# Patient Record
Sex: Female | Born: 1960 | Race: White | Hispanic: No | Marital: Single | Smoking: Former smoker
Health system: Southern US, Community
[De-identification: ages and names within clinical notes are randomized; demographics above are authoritative.]

## PROBLEM LIST (undated history)

## (undated) DIAGNOSIS — M5430 Sciatica, unspecified side: Secondary | ICD-10-CM

## (undated) DIAGNOSIS — I639 Cerebral infarction, unspecified: Secondary | ICD-10-CM

## (undated) DIAGNOSIS — K589 Irritable bowel syndrome without diarrhea: Secondary | ICD-10-CM

## (undated) DIAGNOSIS — I10 Essential (primary) hypertension: Secondary | ICD-10-CM

## (undated) HISTORY — DX: Cerebral infarction, unspecified: I63.9

## (undated) HISTORY — DX: Irritable bowel syndrome without diarrhea: K58.9

## (undated) HISTORY — PX: OTHER SURGICAL HISTORY: SHX169

## (undated) HISTORY — DX: Sciatica, unspecified side: M54.30

---

## 1999-09-23 ENCOUNTER — Encounter: Payer: Self-pay | Admitting: Emergency Medicine

## 1999-09-23 ENCOUNTER — Emergency Department (HOSPITAL_COMMUNITY): Admission: EM | Admit: 1999-09-23 | Discharge: 1999-09-23 | Payer: Self-pay | Admitting: Emergency Medicine

## 2002-04-04 ENCOUNTER — Ambulatory Visit (HOSPITAL_BASED_OUTPATIENT_CLINIC_OR_DEPARTMENT_OTHER): Admission: RE | Admit: 2002-04-04 | Discharge: 2002-04-04 | Payer: Self-pay | Admitting: Orthopedic Surgery

## 2002-06-12 ENCOUNTER — Ambulatory Visit (HOSPITAL_COMMUNITY): Admission: RE | Admit: 2002-06-12 | Discharge: 2002-06-12 | Payer: Self-pay | Admitting: Orthopedic Surgery

## 2004-06-25 ENCOUNTER — Ambulatory Visit: Payer: Self-pay | Admitting: Internal Medicine

## 2005-08-02 ENCOUNTER — Emergency Department (HOSPITAL_COMMUNITY): Admission: EM | Admit: 2005-08-02 | Discharge: 2005-08-02 | Payer: Self-pay | Admitting: Emergency Medicine

## 2006-06-13 ENCOUNTER — Ambulatory Visit: Payer: Self-pay | Admitting: Gastroenterology

## 2007-01-01 ENCOUNTER — Emergency Department (HOSPITAL_COMMUNITY): Admission: EM | Admit: 2007-01-01 | Discharge: 2007-01-01 | Payer: Self-pay | Admitting: Emergency Medicine

## 2007-03-07 ENCOUNTER — Encounter (INDEPENDENT_AMBULATORY_CARE_PROVIDER_SITE_OTHER): Payer: Self-pay | Admitting: Neurology

## 2007-03-07 ENCOUNTER — Ambulatory Visit: Payer: Self-pay

## 2007-03-08 ENCOUNTER — Encounter: Admission: RE | Admit: 2007-03-08 | Discharge: 2007-03-08 | Payer: Self-pay | Admitting: Neurology

## 2007-11-23 DIAGNOSIS — I639 Cerebral infarction, unspecified: Secondary | ICD-10-CM

## 2007-11-23 DIAGNOSIS — K589 Irritable bowel syndrome without diarrhea: Secondary | ICD-10-CM

## 2007-11-23 HISTORY — DX: Cerebral infarction, unspecified: I63.9

## 2007-11-23 HISTORY — DX: Irritable bowel syndrome, unspecified: K58.9

## 2008-05-09 ENCOUNTER — Emergency Department: Payer: Self-pay

## 2010-09-09 NOTE — Op Note (Signed)
   NAME:  Michelle Stanley, Michelle Stanley                         ACCOUNT NO.:  [id number]   MEDICAL RECORD NO.:  [id number]                   PATIENT TYPE:  AMB   LOCATION:  DSC                                  FACILITY:  [facility]   PHYSICIAN:  Thera Flake., M.D.             DATE OF BIRTH:  [birth year]   DATE OF PROCEDURE:  04/04/2002  DATE OF DISCHARGE:                                 OPERATIVE REPORT   INDICATIONS FOR PROCEDURE:  47 one year-old with a right knee injury with  catching and locking of the knee felt to be consistent with a bucket handle  tear of the meniscus felt to be amenable to outpatient surgery.   PREOPERATIVE DIAGNOSIS:  Torn medial meniscus of the right knee.   POSTOPERATIVE DIAGNOSIS:  Torn medial meniscus of the right knee with the  addition of a partial anterior cruciate ligament tear.   PROCEDURES:  1. Medial meniscus repair (two clear fixed).  2. Debridement of anterior cruciate ligament.   SURGEON:  Dyke Brackett, M.D.   ANESTHESIA:  General.   DESCRIPTION OF PROCEDURE:  The patient was examined under anesthesia and had  a Lachman that was probably 2+ and no pivot shift elicited.  With the  arthroscope through the inferomedial and inferolateral portals the  patellofemoral, lateral knee articulation and lateral meniscus were normal.  The anterior cruciate ligament showed probably about a 50% tear of the most  lateral fibers of the anterior cruciate ligament.  The posterior medial  fibers were intact.  The medial meniscus showed a very peripheral tear,  red/white tear, of the posterior horn of ithe meniscus over a distance of  probably 1 cm. This was beginning at the junction of the middle posterior  third of the meniscus.  Freshening of the meniscus was carried out.  Good  meniscus tissue was noted in the intervening segment.  Two clear fixed  anchors reapproximated the tear nicely with good preservation of the tissue  as well as recreation of meniscal  stability.  A synovectomy of the anterior  cruciate ligament stump partial tear was carried out.  The knee was drained  free of fluid.  The portals were closed with nylon and infiltrated with  Marcaine and morphine for a total of 30 cc's 1/2%.                                               Thera Flake., M.D.    WDC/MEDQ  D:  04/04/2002  T:  04/05/2002  Job:  [id number]

## 2011-02-03 LAB — I-STAT 8, (EC8 V) (CONVERTED LAB)
Acid-Base Excess: 1
BUN: 6
Bicarbonate: 24.2 — ABNORMAL HIGH
Chloride: 108
Glucose, Bld: 104 — ABNORMAL HIGH
HCT: 46
Hemoglobin: 15.6 — ABNORMAL HIGH
Operator id: 196461
Potassium: 3.3 — ABNORMAL LOW
Sodium: 139
TCO2: 25
pCO2, Ven: 33.4 — ABNORMAL LOW
pH, Ven: 7.468 — ABNORMAL HIGH

## 2011-02-03 LAB — POCT CARDIAC MARKERS
CKMB, poc: 1.5
Myoglobin, poc: 80.3
Operator id: 196461
Troponin i, poc: <0.05

## 2011-02-03 LAB — POCT I-STAT CREATININE
Creatinine, Ser: 0.8
Operator id: 196461

## 2011-02-03 LAB — D-DIMER, QUANTITATIVE: D-Dimer, Quant: 0.52 — ABNORMAL HIGH

## 2011-09-27 ENCOUNTER — Emergency Department (HOSPITAL_COMMUNITY)
Admission: EM | Admit: 2011-09-27 | Discharge: 2011-09-27 | Disposition: A | Discharge status: Home / Self Care | Payer: Self-pay | Attending: Emergency Medicine | Admitting: Emergency Medicine

## 2011-09-27 ENCOUNTER — Encounter (HOSPITAL_COMMUNITY): Payer: Self-pay | Admitting: Emergency Medicine

## 2011-09-27 DIAGNOSIS — M5430 Sciatica, unspecified side: Secondary | ICD-10-CM | POA: Insufficient documentation

## 2011-09-27 DIAGNOSIS — F17200 Nicotine dependence, unspecified, uncomplicated: Secondary | ICD-10-CM | POA: Insufficient documentation

## 2011-09-27 DIAGNOSIS — I10 Essential (primary) hypertension: Secondary | ICD-10-CM | POA: Insufficient documentation

## 2011-09-27 HISTORY — DX: Essential (primary) hypertension: I10

## 2011-09-27 MED ORDER — HYDROCODONE-ACETAMINOPHEN 5-325 MG PO TABS: 1.0000 | ORAL_TABLET | Freq: Four times a day (QID) | ORAL | Status: AC | PRN

## 2011-09-27 MED ORDER — PREDNISONE (PAK) 10 MG PO TABS: ORAL_TABLET | ORAL | Status: AC

## 2011-09-27 NOTE — ED Notes (Signed)
Patient c/o pain in  rightt hip onset 2 weeks ago states the pain is off and on. States the pain radiates down right  Leg. States after laying or sitting for a period of time pain is worse however after walking for several minutes the pain is a little better. Denies numbness or tingling in leg.

## 2011-09-27 NOTE — Discharge Instructions (Signed)

## 2011-09-27 NOTE — ED Notes (Signed)
Pt c/o lower back pain into left leg x several weeks worse over last several days; pt denies obvious injury

## 2011-09-27 NOTE — ED Provider Notes (Signed)
History   This chart was scribed for Michelle Kras, MD by Melba Coon. The patient was seen in room STRE3/STRE3 and the patient's care was started at 11:48AM.  CSN: [id number] Arrival date & time 09/27/11  1046    Chief Complaint  Patient presents with  . Back Pain   HPI Michelle Stanley is a 51 y.o. female who presents to the Emergency Department complaining of constant, moderate to severe lower back pain that radiates down the left leg with an onset 5 days that has been getting progressively worse. Holding the affected area and walking alleviates the pain; laying down or sitting down for an extended period of time aggravates the pain. No falls or trauma. No Hx of previous similar sx. Sweats present. No HA, fever, neck pain, sore throat, rash, CP, SOB, abd pain, n/v/d, dysuria, or extremity edema, weakness, numbness, or tingling. . No other pertinent medical symptoms.  Past Medical History  Diagnosis Date  . Hypertension     History reviewed. No pertinent past surgical history.  History reviewed. No pertinent family history.  History  Substance Use Topics  . Smoking status: Current Everyday Smoker  . Smokeless tobacco: Not on file  . Alcohol Use: No    OB History    Grav Para Term Preterm Abortions TAB SAB Ect Mult Living                  Review of Systems 10 Systems reviewed and all are negative for acute change except as noted in the HPI.   Allergies  Penicillins  Home Medications   Current Outpatient Rx  Name Route Sig Dispense Refill  . IBUPROFEN 200 MG PO TABS Oral Take 800 mg by mouth every 8 (eight) hours as needed. For pain    . HYDROCODONE-ACETAMINOPHEN 5-325 MG PO TABS Oral Take 1-2 tablets by mouth every 6 (six) hours as needed for pain. 20 tablet 0  . PREDNISONE (PAK) 10 MG PO TABS  Take 6 tabs by mouth daily  for 2 days, then 5 tabs for 2 days, then 4 tabs for 2 days, then 3 tabs for 2 days, 2 tabs for 2 days, then 1 tab by mouth daily for 2 days 42 tablet  0    BP 150/86  Pulse 74  Temp(Src) 98 F (36.7 C) (Oral)  Resp 18  SpO2 98%  Physical Exam  Nursing note and vitals reviewed. Constitutional: She appears well-developed and well-nourished. No distress.  HENT:  Head: Normocephalic and atraumatic.  Right Ear: External ear normal.  Left Ear: External ear normal.  Eyes: Conjunctivae are normal. Right eye exhibits no discharge. Left eye exhibits no discharge. No scleral icterus.  Neck: Neck supple. No tracheal deviation present.  Cardiovascular: Normal rate.   Pulmonary/Chest: Effort normal. No stridor. No respiratory distress.  Musculoskeletal: She exhibits no edema.       Lumbar back: She exhibits no tenderness, no bony tenderness, no swelling, no edema and no deformity.       5/5 strength in bilateral lower extremities; plantar and dorsiflexion nml; nml sensation  Neurological: She is alert. Cranial nerve deficit: no gross deficits.  Skin: Skin is warm and dry. No rash noted.  Psychiatric: She has a normal mood and affect.    ED Course  Procedures (including critical care time)  DIAGNOSTIC STUDIES: Oxygen Saturation is 98% on room air, normal by my interpretation.    COORDINATION OF CARE:  11:52AM -  EDMD will order pain meds for  the pt.   Labs Reviewed - No data to display No results found.   1. Sciatica       MDM  No sign of acute neurological or vascular emergency associated with pt's back pain.  May have a component of sciatica.  Safe for outpatient follow up.  I personally performed the services described in this documentation, which was scribed in my presence.  The recorded information has been reviewed and considered.         Michelle Kras, MD 09/27/11 [phone]

## 2011-10-17 ENCOUNTER — Encounter (HOSPITAL_COMMUNITY): Payer: Self-pay | Admitting: *Deleted

## 2011-10-17 ENCOUNTER — Emergency Department (HOSPITAL_COMMUNITY): Payer: Self-pay

## 2011-10-17 ENCOUNTER — Emergency Department (HOSPITAL_COMMUNITY)
Admission: EM | Admit: 2011-10-17 | Discharge: 2011-10-17 | Disposition: A | Discharge status: Home / Self Care | Payer: Self-pay | Attending: Emergency Medicine | Admitting: Emergency Medicine

## 2011-10-17 DIAGNOSIS — M79609 Pain in unspecified limb: Secondary | ICD-10-CM | POA: Insufficient documentation

## 2011-10-17 DIAGNOSIS — M5126 Other intervertebral disc displacement, lumbar region: Secondary | ICD-10-CM | POA: Insufficient documentation

## 2011-10-17 DIAGNOSIS — M5430 Sciatica, unspecified side: Secondary | ICD-10-CM | POA: Insufficient documentation

## 2011-10-17 DIAGNOSIS — I10 Essential (primary) hypertension: Secondary | ICD-10-CM | POA: Insufficient documentation

## 2011-10-17 MED ORDER — OXYCODONE-ACETAMINOPHEN 5-325 MG PO TABS: 2.0000 | ORAL_TABLET | ORAL | Status: AC | PRN

## 2011-10-17 MED ORDER — HYDROCODONE-ACETAMINOPHEN 5-325 MG PO TABS
2.0000 | ORAL_TABLET | Freq: Once | ORAL | Status: AC
  Administered 2011-10-17: 2 via ORAL
  Filled 2011-10-17: qty 2

## 2011-10-17 MED ORDER — IBUPROFEN 600 MG PO TABS: 600.0000 mg | ORAL_TABLET | Freq: Four times a day (QID) | ORAL | Status: AC | PRN

## 2011-10-17 MED ORDER — PREDNISONE 20 MG PO TABS: 40.0000 mg | ORAL_TABLET | Freq: Every day | ORAL | Status: DC

## 2011-10-17 NOTE — ED Notes (Signed)
Patient with right lower back pain radiating into legs.  Patient seen for same approx.  3 weeks ago with slight improvement post steroids and pain medication, patient states unable to bear weight on legs

## 2011-10-17 NOTE — Discharge Instructions (Signed)
Back Exercises Back exercises help treat and prevent back injuries. The goal is to increase your strength in your belly (abdominal) and back muscles. These exercises can also help with flexibility. Start these exercises when told by your doctor. HOME CARE Back exercises include: Pelvic Tilt.  Lie on your back with your knees bent. Tilt your pelvis until the lower part of your back is against the floor. Hold this position 5 to 10 sec. Repeat this exercise 5 to 10 times.  Knee to Chest.  Pull 1 knee up against your chest and hold for 20 to 30 seconds. Repeat this with the other knee. This may be done with the other leg straight or bent, whichever feels better. Then, pull both knees up against your chest.  Sit-Ups or Curl-Ups.  Bend your knees 90 degrees. Start with tilting your pelvis, and do a partial, slow sit-up. Only lift your upper half 30 to 45 degrees off the floor. Take at least 2 to 3 seonds for each sit-up. Do not do sit-ups with your knees out straight. If partial sit-ups are difficult, simply do the above but with only tightening your belly (abdominal) muscles and holding it as told.  Hip-Lift.  Lie on your back with your knees flexed 90 degrees. Push down with your feet and shoulders as you raise your hips 2 inches off the floor. Hold for 10 seconds, repeat 5 to 10 times.  Back Arches.  Lie on your stomach. Prop yourself up on bent elbows. Slowly press on your hands, causing an arch in your low back. Repeat 3 to 5 times.  Shoulder-Lifts.  Lie face down with arms beside your body. Keep hips and belly pressed to floor as you slowly lift your head and shoulders off the floor.  Do not overdo your exercises. Be careful in the beginning. Exercises may cause you some mild back discomfort. If the pain lasts for more than 15 minutes, stop the exercises until you see your doctor. Improvement with exercise for back problems is slow.  Document Released: 05/13/2010 Document Revised: 03/30/2011  Document Reviewed: 05/13/2010 [employer] Patient Information 2012 [employer]. 

## 2011-10-17 NOTE — ED Provider Notes (Signed)
History   This chart was scribed for Michelle Shi, MD by Sofie Rower. The patient was seen in room TR10C/TR10C and the patient's care was started at 12:28 PM     CSN: [id number]  Arrival date & time 10/17/11  1104   None     Chief Complaint  Patient presents with  . Back Pain    x legs bilaterally    (Consider location/radiation/quality/duration/timing/severity/associated sxs/prior treatment) Patient is a 51 y.o. female presenting with back pain. The history is provided by the patient. No language interpreter was used.  Back Pain  This is a recurrent problem. The current episode started more than 1 week ago. The problem occurs constantly. The problem has been gradually improving. The pain is present in the lumbar spine. The pain radiates to the right thigh. The pain is moderate. The symptoms are aggravated by stress. The pain is the same all the time. Treatments tried: Steroids and pain medications. The treatment provided mild relief.   Michelle Stanley is a 51 y.o. female who presents to the Emergency Department complaining of moderate, intermittent back pain onset three weeks ago with associated symptoms of swelling, radiating right leg pain. The pt informs the EDP that she has slightly improved from taking steroids, however, the pain has become worse this morning. Pt is experiencing radiating pain from the right buttocks down to her right foot. Modifying factors include taking 4 Advil at 6:00AM which provides moderate relief, bearing weight on the legs which intensifies the pain. Pt has a hx of back pain.   Pt denies numbness.      Past Medical History  Diagnosis Date  . Hypertension      History  Substance Use Topics  . Smoking status: Current Everyday Smoker  . Smokeless tobacco: Not on file  . Alcohol Use: No    OB History    Grav Para Term Preterm Abortions TAB SAB Ect Mult Living                  Review of Systems  Musculoskeletal: Positive for back pain.  All  other systems reviewed and are negative.    Allergies  Penicillins  Home Medications   Current Outpatient Rx  Name Route Sig Dispense Refill  . IBUPROFEN 200 MG PO TABS Oral Take 800 mg by mouth every 8 (eight) hours as needed. For pain    . IBUPROFEN 600 MG PO TABS Oral Take 1 tablet (600 mg total) by mouth every 6 (six) hours as needed for pain. 30 tablet 0  . OXYCODONE-ACETAMINOPHEN 5-325 MG PO TABS Oral Take 2 tablets by mouth every 4 (four) hours as needed for pain. 6 tablet 0  . PREDNISONE 20 MG PO TABS Oral Take 2 tablets (40 mg total) by mouth daily. 10 tablet 0    BP 134/77  Pulse 77  Temp 97.8 F (36.6 C) (Oral)  Resp 24  SpO2 98%  Physical Exam  Nursing note and vitals reviewed. Constitutional: She is oriented to person, place, and time. She appears well-developed and well-nourished. No distress.  HENT:  Head: Normocephalic and atraumatic.  Eyes: Pupils are equal, round, and reactive to light.  Neck: Normal range of motion.  Cardiovascular: Normal rate and intact distal pulses.   Pulmonary/Chest: No respiratory distress.  Abdominal: Normal appearance. She exhibits no distension.  Musculoskeletal: Normal range of motion.  Neurological: She is alert and oriented to person, place, and time. No cranial nerve deficit.  1+ patellar reflex on the right,  3+ patellar reflex on the left. 3+ dorsiflex on the right, 3+ dorsiflex on the left.  Skin: Skin is warm and dry. No rash noted.  Psychiatric: She has a normal mood and affect. Her behavior is normal.    ED Course  Procedures (including critical care time)  DIAGNOSTIC STUDIES: Oxygen Saturation is 98% on room air, normal by my interpretation.    COORDINATION OF CARE:   12:32PM- EDP at bedside discusses treatment plan concerning MRI, steroids.   Labs Reviewed - No data to display Mr Lumbar Spine Wo Contrast  10/17/2011  *RADIOLOGY REPORT*  Clinical Data: 3 weeks of severe back pain extending to the right  leg and foot.  The symptoms were particularly painful this morning.  MRI LUMBAR SPINE WITHOUT CONTRAST  Technique:  Multiplanar and multiecho pulse sequences of the lumbar spine were obtained without intravenous contrast.  Comparison: None.  Findings: Normal signals present in the conus medullaris which terminates at L1.  Marrow signal and vertebral body heights are normal.  Slight anterolisthesis at L4-5 is degenerative.  Alignment is otherwise anatomic.  Limited imaging of the abdomen is unremarkable.  The disc levels at L1-2 and above are normal.  L2-3:  A broad-based disc herniation is asymmetric to the left. This extends into the inferior recess of the left neural foramen without significant stenosis.  L3-4:  Mild facet hypertrophy is present bilaterally.  There is no significant disc herniation or stenosis.  L4-5:  Moderate facet hypertrophy is present.  There are joint effusions bilaterally.  There is some uncovering of the disc.  Mild foraminal narrowing is evident bilaterally.  L5-S1:  A shallow central disc protrusion is evident.  There is no significant stenosis.  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  IMPRESSION:  1.  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 2.  Slight anterolisthesis with uncovering of the disc and moderate facet hypertrophy L4-5.  Mild foraminal narrowing is present bilaterally.  Given the extent of facet hypertrophy, the anterolisthesis could be dynamic, flexion and extension images may be useful for further evaluation. Movement could exacerbate the foraminal stenosis. 3.  Shallow central disc protrusion at L5-S1 without significant stenosis.  Original Report Authenticated By: Jamesetta Orleans. MATTERN, M.D.      1. Sciatica       MDM        I personally performed the services described in this documentation, which was scribed in my  presence. The recorded information has been reviewed and considered.     Michelle Shi, MD 10/17/11 [phone]

## 2011-10-30 ENCOUNTER — Ambulatory Visit (INDEPENDENT_AMBULATORY_CARE_PROVIDER_SITE_OTHER): Payer: Self-pay | Admitting: Family Medicine

## 2011-10-30 VITALS — BP 134/86 | Ht 64.0 in | Wt 210.0 lb

## 2011-10-30 DIAGNOSIS — IMO0002 Reserved for concepts with insufficient information to code with codable children: Secondary | ICD-10-CM

## 2011-10-30 DIAGNOSIS — M5410 Radiculopathy, site unspecified: Secondary | ICD-10-CM

## 2011-10-30 MED ORDER — GABAPENTIN 100 MG PO CAPS: ORAL_CAPSULE | ORAL | Status: DC

## 2011-10-30 MED ORDER — IBUPROFEN 800 MG PO TABS: 800.0000 mg | ORAL_TABLET | Freq: Three times a day (TID) | ORAL | Status: AC | PRN

## 2011-10-30 NOTE — Patient Instructions (Addendum)
You have developed a cyst at the S1 level of your spine. I think this is what is causing her pain. I am starting you on a taper up of a medicine called gabapentin.  Take by tablet by mouth Day 1-3:   take one at night  Day 4-6:  Take 2 at night Day 7-10:  take 3 at night Day 11-14:  3 at night, 1 in am Day 15-18:   3 at night, 2 in am Day 19-21: 3 at night, 3 in am Day 22-25: 3 at night, 3 in am, 2 at lunch Day 26 forward : 3 at night, 3 in am, 3 at lunch  I will see her back in 3-4 weeks. If you have any questions please college in the interim. I really think this medicine will significantly decreased her pain when we get it at the right level. I note may be frustrating at first because I know you're in a lot of pain, but hand there I think we'll get some improvement

## 2011-10-31 DIAGNOSIS — M5410 Radiculopathy, site unspecified: Secondary | ICD-10-CM | POA: Insufficient documentation

## 2011-10-31 NOTE — Assessment & Plan Note (Signed)
Long discussion. Will start taper up of gabapentin. We did discuss that ultimately she may benefit from NSU evaluation; currently she has no insurance so we will proceed with appropriate conservative options. Hopefully we can get her radiculopathy calmed down and then work on a core strengthening program to improve body mechanics as I suspect there is a component of dynamic foraminal stenosis.rtc 1 m

## 2011-10-31 NOTE — Progress Notes (Signed)
  Subjective:    Patient ID: Michelle Stanley, female    DOB: 01-22-1961, 51 y.o.   MRN: [id number]  HPI Right low back pain radiating to the posterior leg and foot on the right. No specific injury. Start bothering her about June 1 and likely became excruciating. She was seen at the emergency department and given a steroid dose pack on June 5 which seemed to help while she was on the steroids. After she completed the dose pack the pain came back with its original severity. Doing a lot of standing or try to go upstairs really causes the pain to increase. She cannot lean over to even tie her shoe without having excruciating pain. She's had no leg weakness. She has had one or 2 urinary incontinence episodes but this predated her back problems and seems consistent with urge incontinence. No prior history of low back or hip injury. No history of back surgery. She works at a fairly physical job as a Surveyor, mining but  Does not have to do a lot of heavy lifting.  PERTINENT  PMH / PSH: Right knee arthroscopy for medial meniscus ter History of hypertension  Review of Systems Denies unusual weight change, fever, sweats, chills. See history of present illness above for additional pertinent review of systems.    Objective:   Physical Exam  Vital signs reviewed. GENERAL: Well developed, well nourished, no acute distress BACK: Nontender to percussion of the thoracic or lumbar vertebra. There is no point tenderness or Pierpoint in the lumbar musculature. Lower stream he strength is 5 out of 5 in hip flexion and extension. A very positive straight leg raise at about 60 on the right. NEURO: DTRs 2+ bilaterally equal at the knee and ankle. MUSCULOSKELETAL: Normal muscle bulk and home and quadriceps. 5 out of 5 extension and flexion at the knee bilaterally, 5 out of 5 dorsiflexion and plantar flexion bilaterally.  IMAGING: MRI LS spine. Report:Slight anterolisthesis with uncovering of the disc and  moderate  facet hypertrophy L4-5. Mild foraminal narrowing is present  bilaterally. Given the extent of facet hypertrophy, the  anterolisthesis could be dynamic, flexion and extension images may  be useful for further evaluation. Movement could exacerbate the  foraminal stenosis. Tarlov cyst right S1 foramen  My comments: The facet hypertrophy on the right at L4-L5 seems more significant than "mild" and I suspect there is a dynamic copmponent.        Assessment & Plan:

## 2011-11-20 ENCOUNTER — Encounter: Payer: Self-pay | Admitting: Family Medicine

## 2011-11-20 ENCOUNTER — Ambulatory Visit (INDEPENDENT_AMBULATORY_CARE_PROVIDER_SITE_OTHER): Payer: Self-pay | Admitting: Family Medicine

## 2011-11-20 VITALS — BP 139/92 | HR 94 | Ht 64.0 in | Wt 210.0 lb

## 2011-11-20 DIAGNOSIS — M5410 Radiculopathy, site unspecified: Secondary | ICD-10-CM

## 2011-11-20 DIAGNOSIS — IMO0002 Reserved for concepts with insufficient information to code with codable children: Secondary | ICD-10-CM

## 2011-11-20 NOTE — Patient Instructions (Addendum)
On Aug 4 start with three tabs AM, Three tabs at lunch and 6 at night time. Stay on this dose until I see you back. Let me see you back in 4-6 weeks. I am GLAD you are doing so much better/

## 2011-11-22 NOTE — Assessment & Plan Note (Signed)
Progress with 60% improvement and she is only up to 800 mg of gabapentin at this time. We'll continue to taper her up until she is at 1200. We'll see her back in 4 weeks.

## 2011-11-22 NOTE — Progress Notes (Signed)
  Subjective:    Patient ID: Michelle Stanley, female    DOB: Oct 28, 1960, 51 y.o.   MRN: [id number]  HPI Followup radicular back pain. We have tapered her up on gabapentin at about 60% improvement. She is still having pain particularly in the morning but it is much much less. She is very excited. The pain areas are the same as previously described but the pain is less intense perhaps 4/10 at its worst. She is having periods of time when it is down to 1/10.    Review of Systems Pertinent review of systems: negative for fever or unusual weight change.     Objective:   Physical Exam  Vital signs reviewed. GENERAL: Well developed, well nourished, no acute distress BACK: Nontender to palpation. Lower extremity strength 5 out of 5.      Assessment & Plan:

## 2011-12-29 ENCOUNTER — Ambulatory Visit (INDEPENDENT_AMBULATORY_CARE_PROVIDER_SITE_OTHER): Payer: Self-pay | Admitting: Family Medicine

## 2011-12-29 ENCOUNTER — Other Ambulatory Visit: Payer: Self-pay | Admitting: Family Medicine

## 2011-12-29 ENCOUNTER — Encounter: Payer: Self-pay | Admitting: Family Medicine

## 2011-12-29 VITALS — BP 169/92 | HR 80 | Ht 64.0 in | Wt 210.0 lb

## 2011-12-29 DIAGNOSIS — M5410 Radiculopathy, site unspecified: Secondary | ICD-10-CM

## 2011-12-29 DIAGNOSIS — IMO0002 Reserved for concepts with insufficient information to code with codable children: Secondary | ICD-10-CM

## 2011-12-29 MED ORDER — IBUPROFEN 800 MG PO TABS: 800.0000 mg | ORAL_TABLET | Freq: Three times a day (TID) | ORAL | Status: DC | PRN

## 2011-12-29 MED ORDER — GABAPENTIN 300 MG PO CAPS: ORAL_CAPSULE | ORAL | Status: DC

## 2012-01-01 NOTE — Progress Notes (Signed)
  Subjective:    Patient ID: Michelle Stanley, female    DOB: May 12, 1960, 51 y.o.   MRN: [id number]  HPI  Followup radicular back pain. She is significantly better. She is essentially having only morning time aching low back pain with no radiculopathy unless she does extreme amount of standing. She is able to do everything she needs to do. He's also using ibuprofen along with the gabapentin. Feels like she's about 75-80% improved overall. 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 She is sleeping okay. She's having no numbness, no incontinence, no lower stream any weakness.  Review of Systems See history of present illness.    Objective:   Physical Exam  Vital signs are reviewed. Notably her blood pressure is elevated and she is told to see her primary care provider. GENERAL: Well-developed female no acute distress BACK: Nontender to palpation.Lower stream he strength 5 out of 5 in flexion extension at the hip. GAIT: Normal.       Assessment & Plan:  Low back pain with radiculopathy 60-75% better with ibuprofen and gabapentin. We'll continue at 1200 mg of gabapentin. She'll followup in one month. Still stable at that time we can probably let her go longer. Ultimately she may need gabapentin long-term. Refilled her ibuprofen 800 mg which she takes twice a day or sometimes 3 times a day.

## 2012-05-13 ENCOUNTER — Encounter: Payer: Self-pay | Admitting: Family Medicine

## 2012-05-13 ENCOUNTER — Ambulatory Visit (INDEPENDENT_AMBULATORY_CARE_PROVIDER_SITE_OTHER): Payer: Self-pay | Admitting: Family Medicine

## 2012-05-13 VITALS — BP 142/87 | HR 89 | Ht 64.0 in | Wt 210.0 lb

## 2012-05-13 DIAGNOSIS — M5410 Radiculopathy, site unspecified: Secondary | ICD-10-CM

## 2012-05-13 DIAGNOSIS — IMO0002 Reserved for concepts with insufficient information to code with codable children: Secondary | ICD-10-CM

## 2012-05-13 MED ORDER — GABAPENTIN 300 MG PO CAPS: ORAL_CAPSULE | ORAL | Status: DC

## 2012-05-14 NOTE — Progress Notes (Signed)
  Subjective:    Patient ID: Michelle Stanley, female    DOB: 1960/07/22, 52 y.o.   MRN: [id number]  HPI Followup low back pain with radiculopathy to the right leg. Had been doing really well on her gabapentin, 95% better. 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 No leg weakness. Pain is increased about 20%. No urinary or fecal incontinence. She's otherwise doing well. Not having any problems tolerating the medicine. On couple days she took an extra gabapentin and that really seem to help her pain. She was to discuss that.   Review of Systems Denies fever, sweats, chills.    Objective:   Physical Exam  Vital signs are reviewed GENERAL: Well-developed female no acute distress BILATERAL lower extremity strength 5 out of 5. Some discomfort with straight leg raise but no true radicular pain. Plantar flexion and dorsiflexion 5 out of 5. DTRs 2+ bilaterally equal at the knee. Lower extremity strength in knee flexion and extension 5 out of 5 bilateral.      Assessment & Plan:  I think is perfectly fine to increase her gabapentin and will write her new prescription. I'll see her back 6 months, sooner with problems. We did discuss whether not she can get off his medicine at some point and I think what she's been on a stable dose for 36 months we could certainly try tapering it down. She's done really well with this medicine given her back issues.

## 2012-06-28 ENCOUNTER — Other Ambulatory Visit: Payer: Self-pay | Admitting: Family Medicine

## 2012-08-20 ENCOUNTER — Other Ambulatory Visit: Payer: Self-pay | Admitting: Family Medicine

## 2012-08-23 ENCOUNTER — Other Ambulatory Visit: Payer: Self-pay | Admitting: *Deleted

## 2012-08-23 MED ORDER — GABAPENTIN 300 MG PO CAPS: ORAL_CAPSULE | ORAL | Status: DC

## 2012-12-06 ENCOUNTER — Ambulatory Visit (INDEPENDENT_AMBULATORY_CARE_PROVIDER_SITE_OTHER): Payer: Self-pay | Admitting: Family Medicine

## 2012-12-06 VITALS — BP 129/84 | Ht 64.0 in | Wt 210.0 lb

## 2012-12-06 DIAGNOSIS — IMO0002 Reserved for concepts with insufficient information to code with codable children: Secondary | ICD-10-CM

## 2012-12-06 DIAGNOSIS — M5410 Radiculopathy, site unspecified: Secondary | ICD-10-CM

## 2012-12-06 DIAGNOSIS — E669 Obesity, unspecified: Secondary | ICD-10-CM

## 2012-12-06 MED ORDER — GABAPENTIN 600 MG PO TABS: ORAL_TABLET | ORAL | Status: DC

## 2012-12-06 MED ORDER — NAPROXEN 500 MG PO TABS: 500.0000 mg | ORAL_TABLET | Freq: Two times a day (BID) | ORAL | Status: DC | PRN

## 2012-12-06 NOTE — Patient Instructions (Addendum)
Switch to the naproxen and stop the ibuprofen, You CAN take tylenol in addition to this. I would add 2 500 2 or 3 times a day.  Increase the gabapentin---you are currently on 1800 mg a day---I want to move you to a total of 2400 mg a day. I have sent in some 600 mg tabs. Let me see you in a month

## 2012-12-10 DIAGNOSIS — E669 Obesity, unspecified: Secondary | ICD-10-CM | POA: Insufficient documentation

## 2012-12-10 NOTE — Assessment & Plan Note (Addendum)
Reviewed MRI.The disc levels at L1-2 and above are normal.  L2-3: A broad-based disc herniation is asymmetric to the left.  This extends into the inferior recess of the left neural foramen  without significant stenosis.  L3-4: Mild facet hypertrophy is present bilaterally. There is no  significant disc herniation or stenosis.  L4-5: Moderate facet hypertrophy is present. There are joint  effusions bilaterally. There is some uncovering of the disc. Mild  foraminal narrowing is evident bilaterally.  L5-S1: A shallow central disc protrusion is evident. There is no  significant stenosis.  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  IMPRESSION:  1. Leftward disc herniation at L2-3 with extension into the  inferior recess of the left neural foramen, but no significant  stenosis.  2. Slight anterolisthesis with uncovering of the disc and moderate  facet hypertrophy L4-5. Mild foraminal narrowing is present  bilaterally. Given the extent of facet hypertrophy, the  anterolisthesis could be dynamic, flexion and extension images may  be useful for further evaluation. Movement could exacerbate the  foraminal stenosis.  3. Shallow central disc protrusion at L5-S1 without significant  Stenosis.  comment: I see think the facet hypertrophy is fairly significant. Please this is partly was causing her problem. Also think a Tarlov cyst may be causing some space-occupying type impingement

## 2012-12-10 NOTE — Progress Notes (Signed)
  Subjective:    Patient ID: Michelle Stanley, female    DOB: 07-Nov-1960, 52 y.o.   MRN: [id number]  HPI  followup radiculopathy with lumbar pain. She has had a little bit of increased pain on the left and is now also having some new type of pain on the right. Left leg pain is unchanged in location, radiates down the posterior thigh to the foot. The right sided pain is mostly in the buttock radiating to the posterior right thigh. Does not go past the knee. She's had no ew legWeakness.  Review of Systems No incontinence of bowel or bladder. No numbness in the lower extremities. No fever.    Objective:   Physical Exam Vital signs are reviewed GENERAL: Well-developed overweight female no acute distress BACK mildly diffusely tender to palpation but no significant area point tenderness. There is no defect in the vertebra the back. They're nontender to percussion. Straight leg raise mildly positive on the left, negative on the right. Lower extremity strength is 5 out of 5 in hip flexion and extension. DTRs are 1+ bilaterally equal at the knee and ankle.       Assessment & Plan:   Knee extension and flexion, dorsiflexion plantar flexion have intact strength in bilaterally symmetrical.had some

## 2012-12-13 ENCOUNTER — Telehealth: Payer: Self-pay | Admitting: *Deleted

## 2012-12-13 NOTE — Telephone Encounter (Signed)
Dr. Benson Norway with [facility] and they wanted to know if it was ok to change to change the dosage from 600 mg tablets to 300 mg capsules. It will cost $17 for the 300 mg capsules and $76 for the 600 mg tablets. I ok'ed this

## 2013-01-10 ENCOUNTER — Ambulatory Visit (INDEPENDENT_AMBULATORY_CARE_PROVIDER_SITE_OTHER): Payer: Self-pay | Admitting: Family Medicine

## 2013-01-10 VITALS — BP 146/81 | Ht 64.0 in | Wt 200.0 lb

## 2013-01-10 DIAGNOSIS — M5410 Radiculopathy, site unspecified: Secondary | ICD-10-CM

## 2013-01-10 DIAGNOSIS — IMO0002 Reserved for concepts with insufficient information to code with codable children: Secondary | ICD-10-CM

## 2013-01-10 DIAGNOSIS — M545 Low back pain: Secondary | ICD-10-CM

## 2013-01-10 MED ORDER — IBUPROFEN 800 MG PO TABS: 800.0000 mg | ORAL_TABLET | Freq: Three times a day (TID) | ORAL | Status: DC | PRN

## 2013-01-10 NOTE — Progress Notes (Signed)
  Subjective:    Patient ID: Michelle Stanley, female    DOB: 1960-04-27, 52 y.o.   MRN: [id number]  HPI Followup back pain. It last office visit she was having some new left-sided leg pain. That continues but has localized more on the medial side of her left thigh. It seems to extend from the groin down to read about the knee. She's not had change in bowel or bladder habits. Legs feel a little tingly on the left but no weakness, no falls.   Review of Systems Denies fever, sweats, chills.    Objective:   Physical Exam  Vital signs are reviewed GENERAL: Well-developed overweight female no acute distress HIPS: Internal/external rotation is bilaterally symmetrical and full and painless. Normal strength of hip flexors 5 out of 5 bilaterally symmetrical. NEURO: DTRs 2+ bilaterally equal at the knee and ankle.  IMAGING: I reviewed her MRI from June of 2013. 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 She also had a small cyst at the L5-S1 level that did not impact the cord.    Assessment & Plan:

## 2013-01-10 NOTE — Assessment & Plan Note (Signed)
New left-sided medial thigh pain. Given the findings on her MRI that was done more than a year ago, I am concerned that the synovial cyst on the left to be expanding. We need to get her set up for MRI repeat. In the interim I will continue her on her current dose of gabapentin. She would like to return to a different NSAID, she like ibuprofen better. A follow her up after the MRI.

## 2013-03-26 ENCOUNTER — Other Ambulatory Visit: Payer: Self-pay | Admitting: Family Medicine

## 2013-06-25 ENCOUNTER — Other Ambulatory Visit: Payer: Self-pay | Admitting: Family Medicine

## 2013-07-01 ENCOUNTER — Other Ambulatory Visit: Payer: Self-pay | Admitting: Nurse Practitioner

## 2013-07-01 ENCOUNTER — Ambulatory Visit
Admission: RE | Admit: 2013-07-01 | Discharge: 2013-07-01 | Disposition: A | Discharge status: Home / Self Care | Payer: No Typology Code available for payment source | Source: Ambulatory Visit | Attending: Nurse Practitioner | Admitting: Nurse Practitioner

## 2013-07-01 DIAGNOSIS — R059 Cough, unspecified: Secondary | ICD-10-CM

## 2013-07-01 DIAGNOSIS — R05 Cough: Secondary | ICD-10-CM

## 2013-07-01 DIAGNOSIS — F17200 Nicotine dependence, unspecified, uncomplicated: Secondary | ICD-10-CM

## 2013-08-25 ENCOUNTER — Other Ambulatory Visit: Payer: Self-pay | Admitting: Family Medicine

## 2013-09-24 ENCOUNTER — Other Ambulatory Visit: Payer: Self-pay | Admitting: Family Medicine

## 2014-02-16 ENCOUNTER — Emergency Department: Payer: Self-pay | Admitting: Emergency Medicine

## 2014-02-16 LAB — CBC
HCT: 42.6 % (ref 35.0–47.0)
HGB: 13.3 g/dL (ref 12.0–16.0)
MCH: 29.5 pg (ref 26.0–34.0)
MCHC: 31.2 g/dL — ABNORMAL LOW (ref 32.0–36.0)
MCV: 94 fL (ref 80–100)
PLATELETS: 197 10*3/uL (ref 150–440)
RBC: 4.52 10*6/uL (ref 3.80–5.20)
RDW: 13.6 % (ref 11.5–14.5)
WBC: 14.7 10*3/uL — AB (ref 3.6–11.0)

## 2014-02-16 LAB — BASIC METABOLIC PANEL
Anion Gap: 6 — ABNORMAL LOW (ref 7–16)
BUN: 10 mg/dL (ref 7–18)
CO2: 24 mmol/L (ref 21–32)
Calcium, Total: 8.7 mg/dL (ref 8.5–10.1)
Chloride: 110 mmol/L — ABNORMAL HIGH (ref 98–107)
Creatinine: 0.65 mg/dL (ref 0.60–1.30)
GLUCOSE: 119 mg/dL — AB (ref 65–99)
Potassium: 4 mmol/L (ref 3.5–5.1)
Sodium: 140 mmol/L (ref 136–145)

## 2014-02-16 LAB — TROPONIN I: Troponin-I: <0.02 ng/mL

## 2014-02-16 LAB — D-DIMER(ARMC): D-DIMER: 1057 ng/mL

## 2014-07-25 ENCOUNTER — Inpatient Hospital Stay
Admit: 2014-07-25 | Disposition: A | Discharge status: Home / Self Care | Payer: Self-pay | Attending: Internal Medicine | Admitting: Internal Medicine

## 2014-07-25 LAB — COMPREHENSIVE METABOLIC PANEL
AST: 32 U/L
Albumin: 3.8 g/dL
Alkaline Phosphatase: 82 U/L
Anion Gap: 7 (ref 7–16)
BUN: 9 mg/dL
Bilirubin,Total: 0.5 mg/dL
CHLORIDE: 106 mmol/L
CO2: 26 mmol/L
Calcium, Total: 8.8 mg/dL — ABNORMAL LOW
Creatinine: 0.66 mg/dL
EGFR (Non-African Amer.): >60
Glucose: 122 mg/dL — ABNORMAL HIGH
POTASSIUM: 3.1 mmol/L — AB
SGPT (ALT): 32 U/L
SODIUM: 139 mmol/L
TOTAL PROTEIN: 7.1 g/dL

## 2014-07-25 LAB — URINALYSIS, COMPLETE
BILIRUBIN, UR: NEGATIVE
BLOOD: NEGATIVE
Bacteria: NONE SEEN
GLUCOSE, UR: NEGATIVE mg/dL (ref 0–75)
Ketone: NEGATIVE
LEUKOCYTE ESTERASE: NEGATIVE
NITRITE: NEGATIVE
PH: 6 (ref 4.5–8.0)
Protein: 30
RBC,UR: 2 /HPF (ref 0–5)
Specific Gravity: 1.01 (ref 1.003–1.030)
Squamous Epithelial: 13
WBC UR: 1 /HPF (ref 0–5)

## 2014-07-25 LAB — CBC WITH DIFFERENTIAL/PLATELET
Basophil #: 0 10*3/uL (ref 0.0–0.1)
Basophil %: 0.4 %
Eosinophil #: 0 10*3/uL (ref 0.0–0.7)
Eosinophil %: 0.1 %
HCT: 46.3 % (ref 35.0–47.0)
HGB: 15.4 g/dL (ref 12.0–16.0)
LYMPHS ABS: 1.8 10*3/uL (ref 1.0–3.6)
Lymphocyte %: 40.4 %
MCH: 30.2 pg (ref 26.0–34.0)
MCHC: 33.2 g/dL (ref 32.0–36.0)
MCV: 91 fL (ref 80–100)
MONOS PCT: 11.8 %
Monocyte #: 0.5 x10 3/mm (ref 0.2–0.9)
NEUTROS ABS: 2.1 10*3/uL (ref 1.4–6.5)
Neutrophil %: 47.3 %
Platelet: 134 10*3/uL — ABNORMAL LOW (ref 150–440)
RBC: 5.09 10*6/uL (ref 3.80–5.20)
RDW: 13.5 % (ref 11.5–14.5)
WBC: 4.5 10*3/uL (ref 3.6–11.0)

## 2014-07-25 LAB — TROPONIN I

## 2014-07-26 LAB — BASIC METABOLIC PANEL
Anion Gap: 3 — ABNORMAL LOW (ref 7–16)
BUN: 13 mg/dL
CREATININE: 0.64 mg/dL
Calcium, Total: 8.1 mg/dL — ABNORMAL LOW
Chloride: 110 mmol/L
Co2: 25 mmol/L
Glucose: 162 mg/dL — ABNORMAL HIGH
Potassium: 3.9 mmol/L
Sodium: 138 mmol/L

## 2014-07-26 LAB — CBC WITH DIFFERENTIAL/PLATELET
Basophil #: 0 10*3/uL (ref 0.0–0.1)
Basophil %: 0.2 %
EOS PCT: 0 %
Eosinophil #: 0 10*3/uL (ref 0.0–0.7)
HCT: 40.9 % (ref 35.0–47.0)
HGB: 13.6 g/dL (ref 12.0–16.0)
LYMPHS ABS: 0.8 10*3/uL — AB (ref 1.0–3.6)
Lymphocyte %: 17.3 %
MCH: 30.3 pg (ref 26.0–34.0)
MCHC: 33.4 g/dL (ref 32.0–36.0)
MCV: 91 fL (ref 80–100)
MONOS PCT: 4.5 %
Monocyte #: 0.2 x10 3/mm (ref 0.2–0.9)
Neutrophil #: 3.4 10*3/uL (ref 1.4–6.5)
Neutrophil %: 78 %
PLATELETS: 119 10*3/uL — AB (ref 150–440)
RBC: 4.51 10*6/uL (ref 3.80–5.20)
RDW: 13.5 % (ref 11.5–14.5)
WBC: 4.4 10*3/uL (ref 3.6–11.0)

## 2014-08-04 ENCOUNTER — Encounter: Payer: Self-pay | Admitting: Family Medicine

## 2014-08-04 ENCOUNTER — Ambulatory Visit: Payer: Self-pay | Attending: Family Medicine | Admitting: Family Medicine

## 2014-08-04 VITALS — BP 153/88 | HR 93 | Temp 98.5°F | Resp 20 | Ht 64.0 in | Wt 227.0 lb

## 2014-08-04 DIAGNOSIS — I10 Essential (primary) hypertension: Secondary | ICD-10-CM

## 2014-08-04 DIAGNOSIS — J218 Acute bronchiolitis due to other specified organisms: Secondary | ICD-10-CM

## 2014-08-04 DIAGNOSIS — J209 Acute bronchitis, unspecified: Secondary | ICD-10-CM | POA: Insufficient documentation

## 2014-08-04 DIAGNOSIS — E669 Obesity, unspecified: Secondary | ICD-10-CM

## 2014-08-04 DIAGNOSIS — Z87891 Personal history of nicotine dependence: Secondary | ICD-10-CM | POA: Insufficient documentation

## 2014-08-04 MED ORDER — ALBUTEROL SULFATE HFA 108 (90 BASE) MCG/ACT IN AERS: 2.0000 | INHALATION_SPRAY | Freq: Four times a day (QID) | RESPIRATORY_TRACT | Status: DC | PRN

## 2014-08-04 MED ORDER — LISINOPRIL 10 MG PO TABS: 10.0000 mg | ORAL_TABLET | Freq: Every day | ORAL | Status: DC

## 2014-08-04 NOTE — Patient Instructions (Signed)

## 2014-08-04 NOTE — Progress Notes (Signed)
Patient reports having pneumonia or bronchitis for 3rd time in last 18 months. Patient starts gasping for air. Patient admitted to hospital for oxygen level and breathing last Saturday, 4/1 through Tuesday, 4/4. Patient reports no pain. Breathing has improved with inhaler.

## 2014-08-04 NOTE — Progress Notes (Signed)
Subjective:    Patient ID: Michelle Stanley, female    DOB: 03-14-1961, 54 y.o.   MRN: [id number]  HPI  Michelle Stanley is a new patient presenting today for hospital follow-up from [facility] where she was hospitalized for acute bronchiolitis between 07/24/14 in 07/24/14. We have no medical records in Epic decides a few paper documents precipitated perhaps with her. She had presented for shortness of breath, cough and chest pains and was admitted. She was found to have an elevated blood pressure of 166/84 is no previous history of high blood pressure;Chest x-ray revealed stable left mid lung and bibasilar scarring, cardiomegaly with acute process. She had a CT scan of her chest which revealed scattered mild reticulonodular interstitial accentuation suggesting atypical infectious bronchiolitis and some minor colds scattered atelectasis. In some normal size mediastinal lymph nodes. According to the patient she received IV antibiotics and steroids and was able to quit smoking.      She was discharged once her condition stabilized but was informed she would need to be evaluated for COPD.  Interval history: She reports doing well since discharge and has a few days left on her prednisone and Levaquin tablets. She does have plus. HandiHaler with her which she has been using as a rescue medication and I have advised her that that is more controller medication and she would be needing to use an MDI.  She has a history of chronic low back pain and sees sports medicine and orthopedics where she receives her gabapentin prescription.  Past Medical History  Diagnosis Date  . Hypertension   . Sciatic nerve pain   . Irritable bowel syndrome   . Sciatic pain     Past Surgical History  Procedure Laterality Date  . Knne surgery      History   Social History  . Marital Status: Single    Spouse Name: N/A  . Number of Children: N/A  . Years of Education: N/A   Occupational History  .  Not on file.   Social History Main Topics  . Smoking status: Former Smoker -- 0.50 packs/day    Quit date: 07/17/2014  . Smokeless tobacco: Never Used  . Alcohol Use: No  . Drug Use: No  . Sexual Activity: Not on file   Other Topics Concern  . Not on file   Social History Narrative    Allergies  Allergen Reactions  . Penicillins Other (See Comments)    welps    Current Outpatient Prescriptions on File Prior to Visit  Medication Sig Dispense Refill  . ibuprofen (ADVIL,MOTRIN) 800 MG tablet TAKE 1 TABLET BY MOUTH EVERY 8 HOURS AS NEEDED FOR PAIN 90 tablet prn   No current facility-administered medications on file prior to visit.     Review of Systems  General: negative for fever, weight loss, appetite change Eyes: no visual symptoms. ENT: no ear symptoms, no sinus tenderness, no nasal congestion or sore throat. Neck: no pain  Respiratory: no wheezing, shortness of breath, cough Cardiovascular: no chest pain, no dyspnea on exertion, no pedal edema, no orthopnea. Gastrointestinal: no abdominal pain, no diarrhea, no constipation Genito-Urinary: no urinary frequency, no dysuria, no polyuria. Hematologic: no bruising Endocrine: no cold or heat intolerance Neurological: no headaches, no seizures, no tremors Musculoskeletal: back pain Skin: no pruritus, no rash. Psychological: no depression, no anxiety,       Objective: Filed Vitals:   08/04/14 1405  BP: 153/88  Pulse: 93  Temp: 98.5 F (36.9  C)  Resp: 20      Physical Exam  Constitutional: obese,  Eyes: PERRLA HENT: Head is atraumatic, normal sinuses, normal oropharynx, normal appearing tonsils and palate Neck: normal range of motion, no thyromegaly, no JVD cardiovascular: normal rate and rhythm, normal heart sounds, no murmurs, rub or gallop, no pedal edema Respiratory: clear to auscultation bilaterally, no wheezes, no rales, no rhonchi Abdomen: soft, not tender to palpation, normal bowel sounds, no enlarged  organs Extremities: Full ROM, no tenderness in joints Skin: warm and dry, no lesions. Neurological: alert, oriented x3, cranial nerves I-XII grossly intact Psychological: normal mood.       Assessment & Plan:  54 year old female patient recently discharged from [facility] with respiratory failure secondary to acute bronchiolitis; patient seen with limited records available.  Acute bronchiolitis: Clinical improvement evident. Advised to complete course of prednisone and I am placing her on a metered-dose inhaler tussis with a flow. She would need to be evaluated with a PFT to exclude COPD given the history of smoking as she just stopped 3 weeks ago and PFT done now would be with sub optimal effort given acute illness.  Hypertension: Newly diagnosed: I am commencing lisinopril and she would be reassessed at her next office visit.  Obesity: Encouraged increased physical activity, also to reduce portion sizes.

## 2014-08-18 ENCOUNTER — Encounter: Payer: Self-pay | Admitting: Family Medicine

## 2014-08-18 ENCOUNTER — Ambulatory Visit: Payer: Self-pay | Attending: Family Medicine | Admitting: Family Medicine

## 2014-08-18 VITALS — BP 137/84 | HR 96 | Temp 98.0°F | Resp 18 | Ht 64.0 in | Wt 231.0 lb

## 2014-08-18 DIAGNOSIS — J449 Chronic obstructive pulmonary disease, unspecified: Secondary | ICD-10-CM | POA: Insufficient documentation

## 2014-08-18 DIAGNOSIS — M5410 Radiculopathy, site unspecified: Secondary | ICD-10-CM

## 2014-08-18 DIAGNOSIS — M19042 Primary osteoarthritis, left hand: Secondary | ICD-10-CM

## 2014-08-18 DIAGNOSIS — Z114 Encounter for screening for human immunodeficiency virus [HIV]: Secondary | ICD-10-CM

## 2014-08-18 DIAGNOSIS — I10 Essential (primary) hypertension: Secondary | ICD-10-CM

## 2014-08-18 DIAGNOSIS — K580 Irritable bowel syndrome with diarrhea: Secondary | ICD-10-CM

## 2014-08-18 DIAGNOSIS — M79642 Pain in left hand: Secondary | ICD-10-CM

## 2014-08-18 DIAGNOSIS — R0602 Shortness of breath: Secondary | ICD-10-CM | POA: Insufficient documentation

## 2014-08-18 DIAGNOSIS — M79641 Pain in right hand: Secondary | ICD-10-CM

## 2014-08-18 DIAGNOSIS — Z720 Tobacco use: Secondary | ICD-10-CM

## 2014-08-18 DIAGNOSIS — M19041 Primary osteoarthritis, right hand: Secondary | ICD-10-CM | POA: Insufficient documentation

## 2014-08-18 DIAGNOSIS — F17200 Nicotine dependence, unspecified, uncomplicated: Secondary | ICD-10-CM | POA: Insufficient documentation

## 2014-08-18 DIAGNOSIS — K1370 Unspecified lesions of oral mucosa: Secondary | ICD-10-CM | POA: Insufficient documentation

## 2014-08-18 DIAGNOSIS — J218 Acute bronchiolitis due to other specified organisms: Secondary | ICD-10-CM

## 2014-08-18 LAB — COMPLETE METABOLIC PANEL WITH GFR
ALBUMIN: 3.6 g/dL (ref 3.5–5.2)
ALT: 10 U/L (ref 0–35)
AST: 11 U/L (ref 0–37)
Alkaline Phosphatase: 81 U/L (ref 39–117)
BUN: 8 mg/dL (ref 6–23)
CO2: 22 meq/L (ref 19–32)
Calcium: 8.9 mg/dL (ref 8.4–10.5)
Chloride: 108 mEq/L (ref 96–112)
Creat: 0.7 mg/dL (ref 0.50–1.10)
GLUCOSE: 84 mg/dL (ref 70–99)
Potassium: 4.7 mEq/L (ref 3.5–5.3)
Sodium: 142 mEq/L (ref 135–145)
TOTAL PROTEIN: 6.6 g/dL (ref 6.0–8.3)
Total Bilirubin: 0.4 mg/dL (ref 0.2–1.2)

## 2014-08-18 LAB — CBC
HCT: 38.8 % (ref 36.0–46.0)
Hemoglobin: 12.6 g/dL (ref 12.0–15.0)
MCH: 29.9 pg (ref 26.0–34.0)
MCHC: 32.5 g/dL (ref 30.0–36.0)
MCV: 92.2 fL (ref 78.0–100.0)
MPV: 10.5 fL (ref 8.6–12.4)
Platelets: 300 10*3/uL (ref 150–400)
RBC: 4.21 MIL/uL (ref 3.87–5.11)
RDW: 14.3 % (ref 11.5–15.5)
WBC: 7.1 10*3/uL (ref 4.0–10.5)

## 2014-08-18 LAB — C-REACTIVE PROTEIN: CRP: 0.5 mg/dL (ref <0.60)

## 2014-08-18 LAB — RHEUMATOID FACTOR: Rhuematoid fact SerPl-aCnc: <10 IU/mL (ref <=14)

## 2014-08-18 MED ORDER — GABAPENTIN 300 MG PO CAPS: 600.0000 mg | ORAL_CAPSULE | Freq: Four times a day (QID) | ORAL | Status: DC

## 2014-08-18 MED ORDER — LEVALBUTEROL TARTRATE 45 MCG/ACT IN AERO: 2.0000 | INHALATION_SPRAY | Freq: Four times a day (QID) | RESPIRATORY_TRACT | Status: DC | PRN

## 2014-08-18 MED ORDER — DICLOFENAC SODIUM 75 MG PO TBEC: 75.0000 mg | DELAYED_RELEASE_TABLET | Freq: Two times a day (BID) | ORAL | Status: DC

## 2014-08-18 MED ORDER — LOPERAMIDE HCL 2 MG PO TABS: 2.0000 mg | ORAL_TABLET | Freq: Three times a day (TID) | ORAL | Status: DC

## 2014-08-18 NOTE — Assessment & Plan Note (Signed)
HTN: Goal BP < 140/90 Continue lisinopril  Smoking cessation

## 2014-08-18 NOTE — Assessment & Plan Note (Signed)
Hand pain with burning in L arm: Diclofenac Checking inflammatory markers

## 2014-08-18 NOTE — Assessment & Plan Note (Signed)
IBS with diarrhea: Regular meal pattern; avoidance of large meals; reduced intake of fat, insoluble fibers, caffeine, and gas-producing foods such as beans, cabbage, and onions imodium 2 mg 45 minutes before meals, three times a day

## 2014-08-18 NOTE — Assessment & Plan Note (Signed)
Screening HIV ordered  

## 2014-08-18 NOTE — Assessment & Plan Note (Signed)
A; chronic low back pain, stable. Taking too much ibuprofen P: Diclofenac to replace ibuprofen Continue gabapentin

## 2014-08-18 NOTE — Patient Instructions (Addendum)
Ms. Michelle Stanley,  Thank you for coming in today. It was a pleasure meeting you. I look forward to being your primary doctor.   1. Mouth plaque for 8 days: Rinse with salt and warm water- swish and spit orajel for pain Continue to work on quitting smoking Will follow closely and refer to ENT for biopsy if needed   2. COPD: Continue spiriva daily xopenex is rescue inhaler F/u chest x-ray Pulmonology referral for pulmonary function testing  3. HTN: Goal BP < 140/90 Continue lisinopril   4. Back pain:  Diclofenac to replace ibuprofen Continue gabapentin  5. IBS with diarrhea: Regular meal pattern; avoidance of large meals; reduced intake of fat, insoluble fibers, caffeine, and gas-producing foods such as beans, cabbage, and onions imodium 2 mg 45 minutes before meals, three times a day   6. Hand pain with burning in L arm: Diclofenac Checking inflammatory markers   F/u in 6 weeks for pap smear with me   Dr. Armen PickupFunches

## 2014-08-18 NOTE — Progress Notes (Signed)
Establish Care Complaining of SOB  Hx Tobacco user- Stopped smoking 28 days ago

## 2014-08-18 NOTE — Progress Notes (Addendum)
Subjective:    Patient ID: Michelle Stanley, female    DOB: 1961-04-23, 54 y.o.   MRN: [id number] CC: f/u SOB related to COPD, L arm burning, sore in mouth, finger pain and swelling, IBS with diarrhea HPI 54 yo F smoker:  1. SOB/COPD: patient hospitalized at [facility] 07/24/14-07/27/14  for acute respiratory failure.  Patient brings in records with labs and imaging reports (information submitted for scanning) (no images available). Had evidence of lung scarring, hyperinflation and cardiomegaly w/o congestion on CXR.  Had CT chest with contrast on 07/25/14  That revealed scattered mild reticulonodular interstitial accentuation suggesting atypical infectious bronchiolitis.  She was treated with prednisone taper, Spiriva, xopenex prn. She reports an ECHO was done, no report available, no dx of CHF. She is a longtime smoker. Working to quit smoking using nicotine patch. She has DOE, orthopnea, congestion, cough. She is taking spiriva and xopenex prn. No fever or chest pain. Patient reports PNA x 4 in the past 18 months.   2. HTN: BP elevated during recent hospitalization and at last OV. She was started on lisinopril 10 mg daily. No worsening cough. No edema. No HA or vision change. Slight LE swelling.  3. Sore in mouth: lower mouth sore x 8 days. Painful. No trauma. Recent significant illness requiring steroids. Patient has hx of heavy smoking. Denies heavy ETOH. Treating with peroxide and mouthwash rinses.  4. Burning pain in L arm: x 3 weeks or some. Just below elbow. Comes and goes. Worse with pick up items with L hand. No neck pain or stiffness. No weakness in L hand. No improvement with gabapentin.   5. Hand pain: b/l hand soreness, stiffness, finger joint swelling for many months. Has fam hx of RA. Sometimes has involuntary flexion of finger that she has to manually extended. Takes ibuprofen for chronic low back pain. Has to take 1 800 mg tablet 4-5 times a day although written for TID.   6.  IBS with diarrhea: has frequent BM. Worsening in past 4 months. Loose stool after every meal. No blood in stools. Does have hx of diverticulitis. Denies worsening low back pain, urinary incontinence and groin numbness.   Soc hx: current smoker, cutting back   Review of Systems  Constitutional: Negative for fever and chills.  HENT: Positive for congestion.   Respiratory: Positive for cough and shortness of breath.   Cardiovascular: Positive for leg swelling. Negative for chest pain and palpitations.  Gastrointestinal: Positive for diarrhea. Negative for nausea, vomiting, abdominal pain, constipation, blood in stool, abdominal distention, anal bleeding and rectal pain.  Musculoskeletal: Positive for back pain.  GAD-7: score of 19. 2-4 and 6. 3 to others.     Objective:   Physical Exam BP 137/84 mmHg  Pulse 96  Temp(Src) 98 F (36.7 C) (Oral)  Resp 18  Ht 5\' 4"  (1.626 m)  Wt 231 lb (104.781 kg)  BMI 39.63 kg/m2  SpO2 97%  BP Readings from Last 3 Encounters:  08/18/14 137/84  08/04/14 153/88  01/10/13 146/81   Wt Readings from Last 3 Encounters:  08/18/14 231 lb (104.781 kg)  08/04/14 227 lb (102.967 kg)  01/10/13 200 lb (90.719 kg)   General appearance: alert, cooperative and no distress  Throat: has 1 cm length x 5 mm white plaque on lower inner gumline just left of midline. No bleeding points. No ulcerations. Normal oropharynx.  Neck: no adenopathy, supple, symmetrical, trachea midline and thyroid not enlarged, symmetric, no tenderness/mass/nodules, Full ROM, negative Spurling  Lungs: clear to auscultation bilaterally Heart: regular rate and rhythm, S1, S2 normal, no murmur, click, rub or gallop Abdomen: soft, non-tender; bowel sounds normal; no masses,  no organomegaly Extremities: extremities normal, atraumatic, no cyanosis or edema      Assessment & Plan:

## 2014-08-18 NOTE — Assessment & Plan Note (Addendum)
A: COPD with recent exacerbation, recurrent exacerbation  P: Continue spiriva daily xopenex is rescue inhaler F/u chest x-ray Screening HIV Pulmonology referral for pulmonary function testing

## 2014-08-19 LAB — ANA: Anti Nuclear Antibody(ANA): POSITIVE — AB

## 2014-08-19 LAB — CYCLIC CITRUL PEPTIDE ANTIBODY, IGG: Cyclic Citrullin Peptide Ab: <2 U/mL (ref 0.0–5.0)

## 2014-08-19 LAB — SJOGRENS SYNDROME-A EXTRACTABLE NUCLEAR ANTIBODY: SSA (RO) (ENA) ANTIBODY, IGG: NEGATIVE

## 2014-08-19 LAB — SJOGRENS SYNDROME-B EXTRACTABLE NUCLEAR ANTIBODY: SSB (LA) (ENA) ANTIBODY, IGG: NEGATIVE

## 2014-08-19 LAB — ANTI-NUCLEAR AB-TITER (ANA TITER): ANA Titer 1: NEGATIVE

## 2014-08-19 LAB — SEDIMENTATION RATE: SED RATE: 20 mm/h (ref 0–30)

## 2014-08-19 LAB — ANTI-DNA ANTIBODY, DOUBLE-STRANDED: ds DNA Ab: <1 IU/mL

## 2014-08-19 LAB — HIV ANTIBODY (ROUTINE TESTING W REFLEX): HIV: NONREACTIVE

## 2014-08-23 NOTE — H&P (Signed)
PATIENT NAME:  Michelle Stanley, Michelle Stanley MR#:  [id number] DATE OF BIRTH:  [birth year]  DATE OF ADMISSION:  07/25/2014  PRIMARY CARE PHYSICIAN: None.  EMERGENCY ROOM PHYSICIAN: DR.Forbach Cory   CHIEF COMPLAINT: Abdominal pain.   HISTORY OF PRESENT ILLNESS: A 54 year old female with no past medical problems, comes in because of abdominal pain, nausea, vomiting with a GI illness for about a week. The patient said that her symptoms started with vomiting and diarrhea. She thought had a viral illness with decreased p.o. intake. The patient's diarrhea and vomiting are calming coming down slowly, but she is still not able to eat and keep anything down. Concerning this, she came to the ER. The patient's labs were normal; however, she became hypoxic with O2 saturation dropping to 79% on exertion. Concerning this, we are admitting the patient. The patient's O2 saturations without exertion on room air were like 94%, but dropped to 79% when she got up to walk. The patient was wheezing bilaterally and admitting her for COPD exacerbation. The patient is having some cough and shortness of breath going on for about 3 to 4 days.    PAST MEDICAL HISTORY: Significant for sciatica, hypertension.   ALLERGIES: PENICILLINS.   MEDICATIONS: Neurontin. The patient does not know the dose. She also takes ibuprofen 800 mg daily. She [facility] these medications [facility]. As I mentioned takes Neurontin and Motrin, but does not know the doses. She says she does not take anything else. She does not take Phenergan or Percocet.      SOCIAL HISTORY: Smokes about 1-1/2 packs per day since she was a 54 years old and she smoked the last time on Monday. No drinking. No drugs.   PAST SURGICAL HISTORY: No history of operations.   FAMILY HISTORY: No hypertension or diabetes  REVIEW OF SYSTEMS:  CONSTITUTIONAL: No fever. No fatigue. No weakness.  EYES: No blurred vision.  ENT: No tinnitus. No epistaxis. No  difficulty swallowing.  RESPIRATORY: Does have some cough and shortness of breath.  CARDIOVASCULAR: No chest pain, no orthopnea, no PND.  GASTROINTESTINAL: No nausea. No vomiting. No abdominal pain.  GENITOURINARY: No dysuria.  ENDOCRINE: No polyuria or nocturia.  HEMATOLOGIC: No anemia.  INTEGUMENTARY: No skin rashes.  MUSCULOSKELETAL: No joint pain.  NEUROLOGIC: No numbness or weakness.  PSYCHIATRIC: No anxiety or insomnia.   PHYSICAL EXAMINATION:  VITAL SIGNS: Temperature 98.3, heart rate is 82, blood pressure 138/66, saturations 94% on room air; at rest dropped to 79% on room air with exertion.  GENERAL: This is a well-developed, well-nourished female not in distress.  HEENT: Head: Normocephalic, atraumatic. Eyes: Pupils equal, reacting to light. No conjunctival pallor. No icterus. Nose: No nasal lesions. No drainage. Ears: No drainage or external lesions. Mouth: No lesions, no exudates.  NECK: Supple. No JVD. No carotid bruit. Normal range of motion.  RESPIRATORY: Bilateral expiratory wheeze in all lung fields present. Not using accessory muscles of respiration.  CARDIOVASCULAR: S1, S2 regular. No murmurs. No tachycardia. The patient's pulses are equal at carotid, pedal, and femoral pulses. No peripheral edema.  ABDOMEN: Soft, nontender, nondistended. Bowel sounds are present. No hernias.  MUSCULOSKELETAL: Normal gait and station. No pathology of digits or nails.  EXTREMITIES: Moved x 4.  SKIN: Inspection is normal.  LYMPHATICS: No lymphadenopathy.  VASCULAR: Good pedal pulses.  NEUROLOGIC: Alert, awake, oriented. Cranial nerves II through XII intact. Power 5/5. Sensations are intact. DTRs 2+ bilaterally.  PSYCHIATRIC: Mood and affect are within normal limits.  LABORATORY DATA: Chest x-ray shows stable left midlung and basilar scarring, ( without acute process. CBC: WBC 4.5, hemoglobin 15.4, hematocrit 46.2, platelets 134,000. Troponin less than 0.03. Sodium 139, potassium 3.1,  chloride 106, bicarbonate 26, BUN is 9, creatinine 0.66, glucose 122, calcium 8.8. Urinalysis is clear with no bacteria.   EKG: Normal sinus rhythm with 76 beats per minute.   ASSESSMENT AND PLAN:  1.  The patient is a 54 year old female patient with hypoxia due to chronic obstructive pulmonary disease exacerbation: Admit to hospitalist service, start oxygen, Solu-Medrol, DuoNebs. Empiric Zithromax is started. The patient is ALLERGIC TO PENICILLINS.  needs PFT FOR EVALUATION FOR COPD, 2.  Tobacco abuse: Counseled against smoking for about 10 minutes. The patient is interested in quitting and she says she does not want to smoke again.  3.  Hypokalemia: Replace potassium.  4.  Recent gastrointestinal illness: She is recovering from it. No further treatment except conservative management.  5.  Chest CAT scan is ordered to evaluate for parenchyma of the lung.   TIME SPENT: 55 minutes.    ____________________________ Katha Hamming, MD sk:TT D: 07/25/2014 19:02:40 ET T: 07/25/2014 20:58:59 ET JOB#: [id number]  cc: Katha Hamming, MD, <Dictator> Katha Hamming MD ELECTRONICALLY SIGNED 07/26/2014 15:50

## 2014-08-23 NOTE — Discharge Summary (Signed)
PATIENT NAME:  Michelle Stanley, [name] MR#:  [id number] DATE OF BIRTH:  May 14, 1960  DATE OF ADMISSION:  07/25/2014 DATE OF DISCHARGE:  07/28/2014  ADMISSION DIAGNOSIS: Acute respiratory failure from possible acute chronic obstructive pulmonary disease exacerbation.   DISCHARGE DIAGNOSIS: 1.  Acute respiratory failure from bronchiolitis.  2.  Acute bronchiolitis. 3.  Tobacco dependence.  4.  Stable right adrenal mass.  5.  Abdominal pain with nausea and vomiting and diarrhea, resolved.   CONSULTATIONS: None.   PHYSICAL EXAMINATION AT DISCHARGE:  VITAL SIGNS: The patient was afebrile, temperature 98.1, pulse is 88, respirations 18, blood pressure 143/85, 91% on room air.  GENERAL: The patient is alert, oriented, not in acute distress.  LUNGS: Clear to auscultation without crackles, rales, rhonchi, or wheezing. Normal to percussion.  ABDOMEN: Bowel sounds are positive. Nontender, nondistended. No hepatosplenomegaly. CARDIOVASCULAR: Regular rate and rhythm. No murmurs, gallops, or rubs.   HOSPITAL COURSE: This is a very pleasant 54 year old female with a history of tobacco dependence who presented with wheezing, shortness of breath, found to have acute respiratory failure. For further details, please see prior H and P. 1.  Acute respiratory failure from bronchiolitis. CT scan did show bronchiolitis. No evidence of pulmonary emboli. She has no formal diagnosis of COPD; however, a long history of tobacco dependence. She will need outpatient followup for PFTs to make this clinical diagnosis of COPD.   The patient was initially placed on oxygen. She has weaned off oxygen and is saturating 91% to 93% on room air. She was placed on antibiotics for bronchiolitis, as well as p.o. steroids. 2.  Bronchiolitis. The patient was on azithromycin and steroids.  3.  Tobacco dependence. The patient was encouraged to stop smoking. She was counseled for 3 minutes. She did not want a patch because she did not want a  nicotine patch.  4.  Stable right adrenal mass seen on CT scan. She may need yearly surveillance. 5.  Abdominal pain on admission which had resolved.   DISCHARGE MEDICATIONS:  1.  Gabapentin 300 mg 2 tablets 4 times a day.  2.  Ibuprofen 800 mg 1 to 2 tablets p.r.n.  3.  Xopenex 2 puffs q.i.d. p.r.n.  4.  Prednisone taper starting at 50 mg tapered by 10 mg every 2 days.  5.  Azithromycin 500 mg daily for 5 days.  6.  Spiriva 18 mcg daily.   DISCHARGE DIET: Regular diet.   DISCHARGE ACTIVITY: As tolerated.  DISCHARGE FOLLOWUP: The patient will need to follow up with primary care physician and patient was encouraged to stop smoking.   TIME SPENT: Approximately 40 minutes.  CONDITION: The patient was stable for discharge.   ____________________________ Jezreel Justiniano P. Juliene PinaMody, MD spm:ST D: 07/28/2014 11:47:46 ET T: 07/28/2014 12:23:33 ET JOB#: [id number]  cc: Layn Kye P. Juliene PinaMody, MD, <Dictator> Janyth ContesSITAL P Dona Walby MD ELECTRONICALLY SIGNED 07/29/2014 11:37

## 2014-08-26 ENCOUNTER — Telehealth: Payer: Self-pay | Admitting: *Deleted

## 2014-08-26 NOTE — Telephone Encounter (Signed)
-----   Message from Dessa PhiJosalyn Funches, MD sent at 08/20/2014  9:26 AM EDT ----- Normal labs except for positive ANA with negative titers. Arthritis in hands is most likely osteoarthritis and not rheumatoid arthritis given negative markers of inflammation and rheumatoid factor. Continue current treatment plan

## 2014-08-26 NOTE — Telephone Encounter (Signed)
Pt aware of results 

## 2014-09-07 ENCOUNTER — Other Ambulatory Visit: Payer: Self-pay | Admitting: Family Medicine

## 2014-09-10 ENCOUNTER — Ambulatory Visit (INDEPENDENT_AMBULATORY_CARE_PROVIDER_SITE_OTHER): Payer: Self-pay | Admitting: Internal Medicine

## 2014-09-10 ENCOUNTER — Encounter: Payer: Self-pay | Admitting: Internal Medicine

## 2014-09-10 DIAGNOSIS — J449 Chronic obstructive pulmonary disease, unspecified: Secondary | ICD-10-CM | POA: Insufficient documentation

## 2014-09-10 DIAGNOSIS — J410 Simple chronic bronchitis: Secondary | ICD-10-CM

## 2014-09-10 MED ORDER — TIOTROPIUM BROMIDE MONOHYDRATE 2.5 MCG/ACT IN AERS: 2.0000 | INHALATION_SPRAY | Freq: Every day | RESPIRATORY_TRACT | Status: DC

## 2014-09-10 NOTE — Progress Notes (Signed)
Date: 09/10/2014  MRN# [id number] AMOR HYLE March 15, 1961  Referring Physician:  Dr. Judi Saa is a 54 y.o. old female seen in consultation for Pneumonia\Bronchitis.   CC:  Chief Complaint  Patient presents with  . Advice Only    referred for bronchitis/ recurrent pneumonia 3x in 18 months.    HPI:  Patient is a pleasant 54 year old female seen in consultation today for recent hospitalization of bronchitis and further workup. Patient states that unless CC She's had 3 episodes of pneumonia/proctitis, which included visits to the urgent care. In April 2016 she was seen at [facility] for shortness of breath, cough, fever, diarrhea for which she was admitted 4 days, she was diagnosed with acute respiratory failure from bronchitis and placed on anti-biotics and sterile. Review of hospitalization as stated below. She was discharge on Spiriva, but stop using it due to running out of it. She currently does have a Xopenex inhaler with for which she's been using it 2-3 times per day. She admits to chronic cough with positive sputum production which is thick white at times, she also admits to shortness of breath with movement. Patient is a former smoker quit in March 2016 previously smoked one pack per day for 30 years, admits to intermittent use of marijuana in the past, has 1 pet dog, currently employed as a Teacher, English as a foreign language.    Review of records and Hospitalization by Dr. Dema Severin  DATE OF ADMISSION:  07/25/2014 DATE OF DISCHARGE:  07/28/2014  ADMISSION DIAGNOSIS: Acute respiratory failure from possible acute chronic obstructive pulmonary disease exacerbation.   DISCHARGE DIAGNOSIS: 1.  Acute respiratory failure from bronchiolitis.  2.  Acute bronchiolitis. 3.  Tobacco dependence.  4.  Stable right adrenal mass.  5.  Abdominal pain with nausea and vomiting and diarrhea, resolved.   CONSULTATIONS: None.   PHYSICAL EXAMINATION AT DISCHARGE:  VITAL  SIGNS: The patient was afebrile, temperature 98.1, pulse is 88, respirations 18, blood pressure 143/85, 91% on room air.  GENERAL: The patient is alert, oriented, not in acute distress.  LUNGS: Clear to auscultation without crackles, rales, rhonchi, or wheezing. Normal to percussion.  ABDOMEN: Bowel sounds are positive. Nontender, nondistended. No hepatosplenomegaly. CARDIOVASCULAR: Regular rate and rhythm. No murmurs, gallops, or rubs.   HOSPITAL COURSE: This is a very pleasant 54 year old female with a history of tobacco dependence who presented with wheezing, shortness of breath, found to have acute respiratory failure. For further details, please see prior H and P. 1.  Acute respiratory failure from bronchiolitis. CT scan did show bronchiolitis. No evidence of pulmonary emboli. She has no formal diagnosis of COPD; however, a long history of tobacco dependence. She will need outpatient followup for PFTs to make this clinical diagnosis of COPD.   The patient was initially placed on oxygen. She has weaned off oxygen and is saturating 91% to 93% on room air. She was placed on antibiotics for bronchiolitis, as well as p.o. steroids. 2.  Bronchiolitis. The patient was on azithromycin and steroids.  3.  Tobacco dependence. The patient was encouraged to stop smoking. She was counseled for 3 minutes. She did not want a patch because she did not want a nicotine patch.  4.  Stable right adrenal mass seen on CT scan. She may need yearly surveillance. 5.  Abdominal pain on admission which had resolved.   DISCHARGE MEDICATIONS:  1.  Gabapentin 300 mg 2 tablets 4 times a day.  2.  Ibuprofen 800 mg 1 to 2  tablets p.r.n.  3.  Xopenex 2 puffs q.i.d. p.r.n.  4.  Prednisone taper starting at 50 mg tapered by 10 mg every 2 days.  5.  Azithromycin 500 mg daily for 5 days.  6.  Spiriva 18 mcg daily.  PMHX:   Past Medical History  Diagnosis Date  . Hypertension Dx 2009  . Sciatic nerve pain   . Irritable bowel  syndrome Aug 2009  . Sciatic pain   . Stroke Aug 2009   Surgical Hx:  Past Surgical History  Procedure Laterality Date  . Right knee surgery     Family Hx:  Family History  Problem Relation Age of Onset  . Cancer Mother     deceased of throat & stomach cancer  . Hypertension Mother   . Heart disease Father   . Hypertension Father    Social Hx:   History  Substance Use Topics  . Smoking status: Former Smoker -- 1.00 packs/day for 30 years    Types: Cigarettes    Quit date: 07/17/2014  . Smokeless tobacco: Never Used  . Alcohol Use: No   Medication:   Current Outpatient Rx  Name  Route  Sig  Dispense  Refill  . diclofenac (VOLTAREN) 75 MG EC tablet   Oral   Take 1 tablet (75 mg total) by mouth 2 (two) times daily.   60 tablet   0   . gabapentin (NEURONTIN) 300 MG capsule   Oral   Take 2 capsules (600 mg total) by mouth 4 (four) times daily.   240 capsule   3   . gabapentin (NEURONTIN) 300 MG capsule      TAKE TWO CAPSULES BY MOUTH EVERY MORNING, TWO AT LUNCH AND 4 AT BEDTIME   240 capsule   12   . levalbuterol (XOPENEX HFA) 45 MCG/ACT inhaler   Inhalation   Inhale 2 puffs into the lungs every 6 (six) hours as needed for wheezing.   1 Inhaler   12   . lisinopril (PRINIVIL,ZESTRIL) 10 MG tablet   Oral   Take 1 tablet (10 mg total) by mouth daily.   30 tablet   1   . loperamide (IMODIUM A-D) 2 MG tablet   Oral   Take 1 tablet (2 mg total) by mouth 3 (three) times daily before meals.   90 tablet   1   . tiotropium (SPIRIVA) 18 MCG inhalation capsule   Inhalation   Place 18 mcg into inhaler and inhale daily.             Allergies:  Penicillins  Review of Systems: Gen:  Denies  fever, sweats, chills HEENT: Denies blurred vision, double vision, ear pain, eye pain, hearing loss, nose bleeds, sore throat Cvc:  No dizziness, chest pain or heaviness Resp:   Chronic cough, dyspnea, history of pneumonia/rhonchi this Gi: Denies swallowing difficulty,  stomach pain, nausea or vomiting, diarrhea, constipation, bowel incontinence Gu:  Denies bladder incontinence, burning urine Ext:   No Joint pain, stiffness or swelling Skin: No skin rash, easy bruising or bleeding or hives Endoc:  No polyuria, polydipsia , polyphagia or weight change Psych: No depression, insomnia or hallucinations  Other:  All other systems negative  Physical Examination:   VS: There were no vitals taken for this visit.  General Appearance: No distress  Neuro:without focal findings, mental status, speech normal, alert and oriented, cranial nerves 2-12 intact, reflexes normal and symmetric, sensation grossly normal  HEENT: PERRLA, EOM intact, no ptosis, no other lesions  noticed; Mallampati 3 Pulmonary: normal breath sounds., diaphragmatic excursion normal.No wheezing, No rales. Shallow breath sounds at the bases;   Sputum Production:  none CardiovascularNormal S1,S2.  No m/r/g.  Abdominal aorta pulsation normal.    Abdomen: Benign, Soft, non-tender, No masses, hepatosplenomegaly, No lymphadenopathy Renal:  No costovertebral tenderness  GU:  No performed at this time. Endoc: No evident thyromegaly, no signs of acromegaly or Cushing features Skin:   warm, no rashes, no ecchymosis  Extremities: normal, no cyanosis, clubbing, no edema, warm with normal capillary refill. Other findings:    Rad results: (The following images and results were reviewed by Dr. Dema Severin). CT Chest 07/2014 FINDINGS: Mediastinum/Nodes: 8 mm upper paratracheal lymph node, image 8 series 2. Several other scattered small paratracheal lymph nodes are present along with small subcarinal and small right hilar lymph nodes. An AP window lymph node measures 0.9 cm in short axis on image 20 series 2, upper normal size.  Coronary artery atherosclerosis noted.  Lungs/Pleura: New faint reticulonodular opacity noted inferiorly in the anterior right upper lobe. Subsegmental atelectasis in the right lower  lobe. This is confluent in the posterior basal segment.  Bandlike scarring or atelectasis laterally in the left lower lobe. Faint ill-defined ground-glass opacity anteriorly in the left upper lobe as on image 29 series 3. Reticulonodular interstitial opacity in the left upper lobe as on image 20 of series 3.  Upper abdomen: 2.5 by 1.7 cm right adrenal mass, essentially stable. Event postcontrast the internal density is only 12 Hounsfield units, compatible with adenoma.  Musculoskeletal: Unremarkable   IMPRESSION: 1. Scattered mild reticulonodular interstitial accentuation suggesting atypical infectious bronchiolitis. There is also some mild scattered atelectasis. 2. Upper normal sized mediastinal lymph nodes, but not pathologically enlarged. 3. Coronary artery atherosclerosis. 4. Stable right adrenal mass, likely an adenoma.   Assessment and Plan: The 54 year old female past medical history of tobacco abuse, seen in consultation for recurrent upper spray tract infections. COPD (chronic obstructive pulmonary disease) Patient history and clinical appearance most consistent with COPD, bronchitis-type. Patient counseled extensively on maintaining avoidance of tobacco in any of its subsequent products, avoid atretic cigarettes and other vapors.  Plan: - pulmonary function testing and 6 minute walk test prior to follow up - Xopenox inhaler - 2puff every 3-4 hours as needed for shortness of breath\wheezing\recurrent cough - Spiriva Respimat (2.35mcg)- 2 puff in the morning - gargle and rinse after each use - wt loss, diet and exercise.  - avoid all forms of tobacco\smoke - including : tobacco use, vapors, ecigs, 2nd hand smoke, etc.     Updated Medication List Outpatient Encounter Prescriptions as of 09/10/2014  Medication Sig  . diclofenac (VOLTAREN) 75 MG EC tablet Take 1 tablet (75 mg total) by mouth 2 (two) times daily.  Marland Kitchen gabapentin (NEURONTIN) 300 MG capsule Take 2 capsules  (600 mg total) by mouth 4 (four) times daily.  Marland Kitchen gabapentin (NEURONTIN) 300 MG capsule TAKE TWO CAPSULES BY MOUTH EVERY MORNING, TWO AT LUNCH AND 4 AT BEDTIME  . levalbuterol (XOPENEX HFA) 45 MCG/ACT inhaler Inhale 2 puffs into the lungs every 6 (six) hours as needed for wheezing.  Marland Kitchen lisinopril (PRINIVIL,ZESTRIL) 10 MG tablet Take 1 tablet (10 mg total) by mouth daily.  Marland Kitchen loperamide (IMODIUM A-D) 2 MG tablet Take 1 tablet (2 mg total) by mouth 3 (three) times daily before meals.  . tiotropium (SPIRIVA) 18 MCG inhalation capsule Place 18 mcg into inhaler and inhale daily.   No facility-administered encounter medications on file as  of 09/10/2014.    Orders for this visit: Orders Placed This Encounter  Procedures  . Pulmonary function test    Standing Status: Future     Number of Occurrences:      Standing Expiration Date: 09/10/2015    Scheduling Instructions:     Scheduled in [place]    Order Specific Question:  Where should this test be performed?    Answer:  [facility]    Order Specific Question:  Full PFT: includes the following: basic spirometry, spirometry pre & post bronchodilator, diffusion capacity (DLCO), lung volumes    Answer:  Full PFT    Order Specific Question:  MIP/MEP    Answer:  No    Order Specific Question:  6 minute walk    Answer:  Yes    Order Specific Question:  ABG    Answer:  No    Order Specific Question:  Diffusion capacity (DLCO)    Answer:  No    Order Specific Question:  Lung volumes    Answer:  No    Order Specific Question:  Methacholine challenge    Answer:  No     Thank  you for the consultation and for allowing [facility] to assist in the care of your patient. Our recommendations are noted above.  Please contact us if we can be of further service.   Stephanie AcreVishal Willett Lefeber, MD [facility] Office Number: [phone]

## 2014-09-10 NOTE — Patient Instructions (Addendum)
Follow up with Dr. Dema SeverinMungal in 6-8 wks  - pulmonary function testing and 6 minute walk test prior to follow up - Xopenox inhaler - 2puff every 3-4 hours as needed for shortness of breath\wheezing\recurrent cough - Spiriva Respimat (2.275mcg)- 2 puff in the morning - gargle and rinse after each use - wt loss, diet and exercise.  - avoid all forms of tobacco\smoke - including : tobacco use, vapors, ecigs, 2nd hand smoke, etc.

## 2014-09-11 ENCOUNTER — Ambulatory Visit: Payer: Self-pay

## 2014-09-23 NOTE — Assessment & Plan Note (Signed)
Patient history and clinical appearance most consistent with COPD, bronchitis-type. Patient counseled extensively on maintaining avoidance of tobacco in any of its subsequent products, avoid atretic cigarettes and other vapors.  Plan: - pulmonary function testing and 6 minute walk test prior to follow up - Xopenox inhaler - 2puff every 3-4 hours as needed for shortness of breath\wheezing\recurrent cough - Spiriva Respimat (2.585mcg)- 2 puff in the morning - gargle and rinse after each use - wt loss, diet and exercise.  - avoid all forms of tobacco\smoke - including : tobacco use, vapors, ecigs, 2nd hand smoke, etc.

## 2014-10-07 ENCOUNTER — Ambulatory Visit: Payer: Self-pay | Attending: Family Medicine

## 2014-10-09 ENCOUNTER — Other Ambulatory Visit: Payer: Self-pay | Admitting: Family Medicine

## 2014-10-15 ENCOUNTER — Other Ambulatory Visit: Payer: Self-pay | Admitting: Family Medicine

## 2014-10-15 ENCOUNTER — Ambulatory Visit (INDEPENDENT_AMBULATORY_CARE_PROVIDER_SITE_OTHER): Payer: Self-pay | Admitting: Internal Medicine

## 2014-10-15 ENCOUNTER — Other Ambulatory Visit: Payer: Self-pay | Admitting: *Deleted

## 2014-10-15 ENCOUNTER — Encounter: Payer: Self-pay | Admitting: Internal Medicine

## 2014-10-15 ENCOUNTER — Telehealth: Payer: Self-pay | Admitting: Family Medicine

## 2014-10-15 VITALS — BP 118/72 | HR 88 | Ht 64.0 in | Wt 228.0 lb

## 2014-10-15 DIAGNOSIS — J410 Simple chronic bronchitis: Secondary | ICD-10-CM

## 2014-10-15 DIAGNOSIS — R0609 Other forms of dyspnea: Secondary | ICD-10-CM | POA: Insufficient documentation

## 2014-10-15 DIAGNOSIS — M5410 Radiculopathy, site unspecified: Secondary | ICD-10-CM

## 2014-10-15 DIAGNOSIS — E669 Obesity, unspecified: Secondary | ICD-10-CM

## 2014-10-15 LAB — PULMONARY FUNCTION TEST
DL/VA % pred: 55 %
DL/VA: 2.64 ml/min/mmHg/L
DLCO unc % pred: 43 %
DLCO unc: 10.66 ml/min/mmHg
FEF 25-75 Post: 0.69 L/sec
FEF 25-75 Pre: 0.92 L/sec
FEF2575-%Change-Post: -24 %
FEF2575-%Pred-Post: 26 %
FEF2575-%Pred-Pre: 35 %
FEV1-%Change-Post: -7 %
FEV1-%Pred-Post: 48 %
FEV1-%Pred-Pre: 52 %
FEV1-Post: 1.32 L
FEV1-Pre: 1.42 L
FEV1FVC-%Change-Post: 1 %
FEV1FVC-%Pred-Pre: 89 %
FEV6-%Change-Post: -8 %
FEV6-%Pred-Post: 55 %
FEV6-%Pred-Pre: 60 %
FEV6-Post: 1.84 L
FEV6-Pre: 2.02 L
FEV6FVC-%Pred-Post: 103 %
FEV6FVC-%Pred-Pre: 103 %
FVC-%Change-Post: -8 %
FVC-%Pred-Post: 53 %
FVC-%Pred-Pre: 58 %
FVC-Post: 1.84 L
FVC-Pre: 2.02 L
Post FEV1/FVC ratio: 72 %
Post FEV6/FVC ratio: 100 %
Pre FEV1/FVC ratio: 71 %
Pre FEV6/FVC Ratio: 100 %

## 2014-10-15 MED ORDER — TIOTROPIUM BROMIDE MONOHYDRATE 2.5 MCG/ACT IN AERS: 2.0000 | INHALATION_SPRAY | Freq: Every day | RESPIRATORY_TRACT | Status: DC

## 2014-10-15 MED ORDER — FLUTICASONE-SALMETEROL 500-50 MCG/DOSE IN AEPB: 1.0000 | INHALATION_SPRAY | Freq: Two times a day (BID) | RESPIRATORY_TRACT | Status: DC

## 2014-10-15 NOTE — Assessment & Plan Note (Signed)
OBESITY  Discussed importance of weight reduction.  Educated regarding limitation of  intake of greasy/fried foods.  Instructed on benefit of  a low-impact exercise program, starting slowly.  Discussed benefits of 30-45 minutes of some form of exercise daily as well as benefit of supervised exercise program.  Patient with financial strain at this time, we'll discuss obstructive sleep apnea at follow-up visit.

## 2014-10-15 NOTE — Progress Notes (Signed)
SMW performed today. 

## 2014-10-15 NOTE — Addendum Note (Signed)
Addended by: Alease Frame on: 10/15/2014 03:49 PM   Modules accepted: Orders, Medications

## 2014-10-15 NOTE — Progress Notes (Signed)
PFT performed today. 

## 2014-10-15 NOTE — Patient Instructions (Addendum)
Follow up with Dr. Dema Severin in 3 months - cont with Spiriva Respimat - 2 puff in the morning, gargle and rinse after each use - this is a maintenance drug - we wills start you on Advair for moderate to severe COPD - Advair 500/50 - 1 puff in the AM and 1 puff in the PM - gargle and rinse after each use - this is a maintenance drug - xopenox inhaler - 2puff every 3-4 hours as needed for shortness of breath\wheezing\recurrent cough - this is a RESCUE medication  - exercise, diet, healthy lifestyle modification - speak to your PMD about financial assistance for your other medical issues.

## 2014-10-15 NOTE — Progress Notes (Signed)
MRN# [id number] Michelle Stanley [birth year]   CC: Chief Complaint  Michelle Stanley presents with  . Follow-up    Extreme SOB w/activity/ PFT/SMW results      Brief History: HPI 08/2014 Michelle Stanley is a pleasant 54 year old female seen in consultation today for recent hospitalization of bronchitis and further workup. Michelle Stanley states that unless CC She's had 3 episodes of pneumonia/proctitis, which included visits to the urgent care. In April 2016 she was seen at [facility] for shortness of breath, cough, fever, diarrhea for which she was admitted 4 days, she was diagnosed with acute respiratory failure from bronchitis and placed on anti-biotics and sterile. Review of hospitalization as stated below. She was discharge on Spiriva, but stop using it due to running out of it. She currently does have a Xopenex inhaler with for which she's been using it 2-3 times per day. She admits to chronic cough with positive sputum production which is thick white at times, she also admits to shortness of breath with movement. Michelle Stanley is a former smoker quit in March 2016 previously smoked one pack per day for 30 years, admits to intermittent use of marijuana in the past, has 1 pet dog, currently employed as a Doctor, general practice. Plan: - pulmonary function testing and 6 minute walk test prior to follow up - Xopenox inhaler - 2puff every 3-4 hours as needed for shortness of breath\wheezing\recurrent cough - Spiriva Respimat (2.27mg)- 2 puff in the morning - gargle and rinse after each use - wt loss, diet and exercise.  - avoid all forms of tobacco\smoke - including : tobacco use, vapors, ecigs, 2nd hand smoke, etc.   Events since last clinic visit: Percent today for follow-up of COPD. She was also scheduled for a 6 minute walk test along with a pulmonary function test. Michelle Stanley states since her last visit she's been doing fairly well she was given Spiriva at that visit, and states has Spiriva has made a  difference in her breathing. She still endorses cough with thick white productive sputum throughout the day, and still has dyspnea on exertion. She is also still using albuterol about 3 times per day, but states is not really make a difference in her overall breathing.     Medication:   Current Outpatient Rx  Name  Route  Sig  Dispense  Refill  . diclofenac (VOLTAREN) 75 MG EC tablet   Oral   Take 1 tablet (75 mg total) by mouth 2 (two) times daily.   60 tablet   0   . gabapentin (NEURONTIN) 300 MG capsule   Oral   Take 2 capsules (600 mg total) by mouth 4 (four) times daily.   240 capsule   3   . gabapentin (NEURONTIN) 300 MG capsule      TAKE TWO CAPSULES BY MOUTH EVERY MORNING, TWO AT LUNCH AND 4 AT BEDTIME   240 capsule   12   . levalbuterol (XOPENEX HFA) 45 MCG/ACT inhaler   Inhalation   Inhale 2 puffs into the lungs every 6 (six) hours as needed for wheezing.   1 Inhaler   12   . lisinopril (PRINIVIL,ZESTRIL) 10 MG tablet   Oral   Take 1 tablet (10 mg total) by mouth daily.   30 tablet   1   . loperamide (IMODIUM A-D) 2 MG tablet   Oral   Take 1 tablet (2 mg total) by mouth 3 (three) times daily before meals.   90 tablet   1   .  tiotropium (SPIRIVA) 18 MCG inhalation capsule   Inhalation   Place 18 mcg into inhaler and inhale daily.         . Tiotropium Bromide Monohydrate (SPIRIVA RESPIMAT) 2.5 MCG/ACT AERS   Inhalation   Inhale 2 puffs into the lungs daily. Rinse and gargle after each use.   4 g   12      Review of Systems: Gen:  Denies  fever, sweats, chills HEENT: Denies blurred vision, double vision, ear pain, eye pain, hearing loss, nose bleeds, sore throat Cvc:  No dizziness, chest pain or heaviness Resp:   Admits to: Dr. cough thick white sputum, shortness of breath with exertion Gi: Denies swallowing difficulty, stomach pain, nausea or vomiting, diarrhea, constipation, bowel incontinence Gu:  Denies bladder incontinence, burning  urine Ext:   No Joint pain, stiffness or swelling Skin: No skin rash, easy bruising or bleeding or hives Endoc:  No polyuria, polydipsia , polyphagia or weight change Other:  All other systems negative  Allergies:  Penicillins  Physical Examination:  VS: There were no vitals taken for this visit.  General Appearance: No distress  HEENT: PERRLA, no ptosis, no other lesions noticed Pulmonary:normal breath sounds., Decreased breath sounds at the bilateral bases, no wheezes, no crackles, no rhonchi. Cardiovascular:  Normal S1,S2.  No m/r/g.     Abdomen:Exam: Benign, Soft, non-tender, No masses  Skin:   warm, no rashes, no ecchymosis  Extremities: normal, no cyanosis, clubbing, warm with normal capillary refill.    Pulmonary function testing 10/15/2014 FVC 58% FEV1 52% FEV1/FVC 71% RV 101 TLC 76% RV/TLC 132% ERV 17% DLCO uncorrected 43% Impression: Moderate obstruction with no cervical response to bronchodilation, severe decrease in ERV, moderate to severe decrease in DLCO.  6 minute walk test: Michelle Stanley only completed 4 minutes due to shortness of breath. Michelle Stanley walked 144 m, low saturation 96%, highest heart rate 96.  Assessment and Plan: 54 year old female with COPD seen for follow-up visit. Obesity OBESITY  Discussed importance of weight reduction.  Educated regarding limitation of  intake of greasy/fried foods.  Instructed on benefit of  a low-impact exercise program, starting slowly.  Discussed benefits of 30-45 minutes of some form of exercise daily as well as benefit of supervised exercise program.  Michelle Stanley with financial strain at this time, we'll discuss obstructive sleep apnea at follow-up visit.   COPD (chronic obstructive pulmonary disease) Michelle Stanley history and clinical appearance most consistent with COPD, bronchitis-type. Michelle Stanley counseled extensively on maintaining avoidance of tobacco in any of its subsequent products, avoid atretic cigarettes and other  vapors. Pulmonary function testing 10/15/2014: FEV1 50%, FEV1/FVC 71%, DLCO 43%. Moderate obstruction, no sick and response to bronchodilation as, severe reduction in DLCO.  At the last visit Michelle Stanley was using Spiriva, she has critical benefit from it. She still endorses shortness of breath with exertion. Will check MMR C and CAT score at next visit, today we also discussed starting commendation therapy with Advair in addition to her Spiriva, for which she is in agreement with. She did state that she is currently not working, does not have insurance, and will require Michelle Stanley assistance for Spiriva and Advair.  Plan: - Start Advair 500/50, 1 puff in the morning, 1 puff in the afternoon, gargle and rinse after each use. - Xopenox inhaler - 2puff every 3-4 hours as needed for shortness of breath\wheezing\recurrent cough - Spiriva Respimat (2.39mg)- 2 puff in the morning - gargle and rinse after each use - wt loss, diet and exercise.  -  avoid all forms of tobacco\smoke - including : tobacco use, vapors, ecigs, 2nd hand smoke, etc.    DOE (dyspnea on exertion) Multifactorial: Obesity, COPD, deconditioning, and activity.  Plan: -Optimize COPD, weight loss reduction, increase exercise, healthy lifestyle modifications.    Updated Medication List Outpatient Encounter Prescriptions as of 10/15/2014  Medication Sig  . diclofenac (VOLTAREN) 75 MG EC tablet Take 1 tablet (75 mg total) by mouth 2 (two) times daily.  Marland Kitchen gabapentin (NEURONTIN) 300 MG capsule Take 2 capsules (600 mg total) by mouth 4 (four) times daily.  Marland Kitchen levalbuterol (XOPENEX HFA) 45 MCG/ACT inhaler Inhale 2 puffs into the lungs every 6 (six) hours as needed for wheezing.  Marland Kitchen lisinopril (PRINIVIL,ZESTRIL) 10 MG tablet Take 1 tablet (10 mg total) by mouth daily.  Marland Kitchen loperamide (IMODIUM A-D) 2 MG tablet Take 1 tablet (2 mg total) by mouth 3 (three) times daily before meals.  . tiotropium (SPIRIVA) 18 MCG inhalation capsule Place 18 mcg  into inhaler and inhale daily.  . Tiotropium Bromide Monohydrate (SPIRIVA RESPIMAT) 2.5 MCG/ACT AERS Inhale 2 puffs into the lungs daily. Rinse and gargle after each use.  . [DISCONTINUED] gabapentin (NEURONTIN) 300 MG capsule TAKE TWO CAPSULES BY MOUTH EVERY MORNING, TWO AT LUNCH AND 4 AT BEDTIME   No facility-administered encounter medications on file as of 10/15/2014.    Orders for this visit: No orders of the defined types were placed in this encounter.    Thank  you for the visitation and for allowing  [facility] to assist in the care of your Michelle Stanley. Our recommendations are noted above.  Please contact us if we can be of further service.  Vilinda Boehringer, MD [facility] Office Number: [phone]

## 2014-10-15 NOTE — Assessment & Plan Note (Signed)
Multifactorial: Obesity, COPD, deconditioning, and activity.  Plan: -Optimize COPD, weight loss reduction, increase exercise, healthy lifestyle modifications.

## 2014-10-15 NOTE — Assessment & Plan Note (Signed)
Patient history and clinical appearance most consistent with COPD, bronchitis-type. Patient counseled extensively on maintaining avoidance of tobacco in any of its subsequent products, avoid atretic cigarettes and other vapors. Pulmonary function testing 10/15/2014: FEV1 50%, FEV1/FVC 71%, DLCO 43%. Moderate obstruction, no sick and response to bronchodilation as, severe reduction in DLCO.  At the last visit patient was using Spiriva, she has critical benefit from it. She still endorses shortness of breath with exertion. Will check MMR C and CAT score at next visit, today we also discussed starting commendation therapy with Advair in addition to her Spiriva, for which she is in agreement with. She did state that she is currently not working, does not have insurance, and will require patient assistance for Spiriva and Advair.  Plan: - Start Advair 500/50, 1 puff in the morning, 1 puff in the afternoon, gargle and rinse after each use. - Xopenox inhaler - 2puff every 3-4 hours as needed for shortness of breath\wheezing\recurrent cough - Spiriva Respimat (2.3mg)- 2 puff in the morning - gargle and rinse after each use - wt loss, diet and exercise.  - avoid all forms of tobacco\smoke - including : tobacco use, vapors, ecigs, 2nd hand smoke, etc.

## 2014-10-15 NOTE — Telephone Encounter (Signed)
Patient has called in today to see if she can receive a medication refill on two medications; please f/u with patient about her request; patient is on her last tablet of Lisinopril;

## 2014-10-16 ENCOUNTER — Other Ambulatory Visit: Payer: Self-pay | Admitting: *Deleted

## 2014-10-16 MED ORDER — LISINOPRIL 10 MG PO TABS: 10.0000 mg | ORAL_TABLET | Freq: Every day | ORAL | Status: DC

## 2014-10-16 NOTE — Telephone Encounter (Signed)
Rx Lisinopril refills send to [facility]

## 2014-10-19 ENCOUNTER — Other Ambulatory Visit: Payer: Self-pay | Admitting: Family Medicine

## 2014-10-19 DIAGNOSIS — M5410 Radiculopathy, site unspecified: Secondary | ICD-10-CM

## 2014-10-19 MED ORDER — DICLOFENAC SODIUM 75 MG PO TBEC: 75.0000 mg | DELAYED_RELEASE_TABLET | Freq: Two times a day (BID) | ORAL | Status: DC

## 2014-10-22 ENCOUNTER — Ambulatory Visit: Payer: Self-pay | Attending: Family Medicine | Admitting: Family Medicine

## 2014-10-22 ENCOUNTER — Encounter: Payer: Self-pay | Admitting: Family Medicine

## 2014-10-22 VITALS — BP 146/97 | HR 86 | Temp 97.9°F | Resp 16 | Ht 64.0 in | Wt 230.0 lb

## 2014-10-22 DIAGNOSIS — M19042 Primary osteoarthritis, left hand: Secondary | ICD-10-CM | POA: Insufficient documentation

## 2014-10-22 DIAGNOSIS — F418 Other specified anxiety disorders: Secondary | ICD-10-CM | POA: Insufficient documentation

## 2014-10-22 DIAGNOSIS — M5410 Radiculopathy, site unspecified: Secondary | ICD-10-CM | POA: Insufficient documentation

## 2014-10-22 DIAGNOSIS — K580 Irritable bowel syndrome with diarrhea: Secondary | ICD-10-CM | POA: Insufficient documentation

## 2014-10-22 DIAGNOSIS — M79642 Pain in left hand: Secondary | ICD-10-CM | POA: Insufficient documentation

## 2014-10-22 DIAGNOSIS — M19041 Primary osteoarthritis, right hand: Secondary | ICD-10-CM | POA: Insufficient documentation

## 2014-10-22 DIAGNOSIS — M79641 Pain in right hand: Secondary | ICD-10-CM | POA: Insufficient documentation

## 2014-10-22 DIAGNOSIS — F419 Anxiety disorder, unspecified: Secondary | ICD-10-CM

## 2014-10-22 DIAGNOSIS — F329 Major depressive disorder, single episode, unspecified: Secondary | ICD-10-CM

## 2014-10-22 DIAGNOSIS — F32A Depression, unspecified: Secondary | ICD-10-CM

## 2014-10-22 DIAGNOSIS — Z87891 Personal history of nicotine dependence: Secondary | ICD-10-CM | POA: Insufficient documentation

## 2014-10-22 MED ORDER — TRAMADOL HCL 50 MG PO TABS: 50.0000 mg | ORAL_TABLET | Freq: Every evening | ORAL | Status: DC | PRN

## 2014-10-22 MED ORDER — PREGABALIN 50 MG PO CAPS: 50.0000 mg | ORAL_CAPSULE | Freq: Three times a day (TID) | ORAL | Status: DC

## 2014-10-22 MED ORDER — KETOROLAC TROMETHAMINE 60 MG/2ML IM SOLN
60.0000 mg | Freq: Once | INTRAMUSCULAR | Status: AC
  Administered 2014-10-22: 60 mg via INTRAMUSCULAR

## 2014-10-22 MED ORDER — DULOXETINE HCL 30 MG PO CPEP: 30.0000 mg | ORAL_CAPSULE | Freq: Every day | ORAL | Status: DC

## 2014-10-22 NOTE — Patient Instructions (Signed)
Ms. Aggie HackerBryson,  Thank you for coming in today  1. Osteoarthritis of hands: Toradol shot today Tramadol rx as needed for pain Wrist splints  2. 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 Depression and anxiety: Cymbalta 30 mg daily  4. IBS with diarrhea: GI referral placed  Question about apply for disability, yes I believe you should.  F/u in 3-4 weeks for depression and anxiety   Dr. Armen PickupFunches

## 2014-10-22 NOTE — Assessment & Plan Note (Signed)
IBS with diarrhea: GI referral placed

## 2014-10-22 NOTE — Assessment & Plan Note (Signed)
1. Osteoarthritis of hands: Toradol shot today Tramadol rx as needed for pain Wrist splints

## 2014-10-22 NOTE — Progress Notes (Signed)
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

## 2014-10-22 NOTE — Assessment & Plan Note (Signed)
Depression and anxiety: Cymbalta 30 mg daily

## 2014-10-22 NOTE — Progress Notes (Signed)
   Subjective:    Patient ID: Michelle Stanley, female    DOB: 1960/07/27, 54 y.o.   MRN: [id number] CC: hand pain, knee pain, chronic low back pain, COPD, depression HPI 54 yo F presents for f/u visit:  1. COPD: has been evaluated by pulmonology. Now taking spiriva, advair and xopenex prn. No cough. She is SOB of minimal exertion. No fever or chills.   2. Pain: in hands, ankle, low back, pain is radicular, described as achy, stiffness, sharp pains and numbness. No joint swelling. Pain is worsened by prolonged standing and trying to use her hands. She is developing nodules in hand joints. She has morning stiffness. No redness. Taking gabapentin and voltaren that is not helping much.   3. Depression and anxiety: patient is reporting nervousness and depressed mood for months. She has been unable to work due to SOB and pain.   4. IBS with diarrhea: still having diarrhea. Having accidents. imodium is not helping. Diarrhea is not usually associated with pain. Has not lost weight. Has not had screening colonoscopy or seen GI for IBS symptoms.   Soc Hx: light smoker, 1 cig now and then  Review of Systems  Constitutional: Negative for fever and chills.  Respiratory: Positive for shortness of breath.   Cardiovascular: Negative for chest pain.  Gastrointestinal: Positive for diarrhea. Negative for abdominal pain and blood in stool.  Musculoskeletal: Positive for back pain, arthralgias and gait problem.  Skin: Negative for rash.  Psychiatric/Behavioral: Positive for sleep disturbance and dysphoric mood. Negative for suicidal ideas. The patient is nervous/anxious.    GAD-7: score of 20. 3 to 1-6 and 2 to 7.      Objective:   Physical Exam BP 146/97 mmHg  Pulse 86  Temp(Src) 97.9 F (36.6 C) (Oral)  Resp 16  Ht 5\' 4"  (1.626 m)  Wt 230 lb (104.327 kg)  BMI 39.46 kg/m2  SpO2 97%  Wt Readings from Last 3 Encounters:  10/22/14 230 lb (104.327 kg)  10/15/14 228 lb (103.42 kg)  08/18/14 231 lb  (104.781 kg)  General appearance: alert, cooperative, no distress and moderately obese Extremities: no edema, nodules at MCP and PIP joints of hands Gait: antalgic  Affect: alert, oriented, a bit agitated and tearful during the exam       Assessment & Plan:

## 2014-10-22 NOTE — Assessment & Plan Note (Signed)
  2. Chronic pain in neck and back with radiculopathy: Gabapentin to be continued Lyrica will replace gabapentin

## 2014-11-09 ENCOUNTER — Telehealth: Payer: Self-pay | Admitting: Clinical

## 2014-11-09 NOTE — Telephone Encounter (Signed)
F/u w pt; pt states [facility] will come in next week and [facility]

## 2014-11-23 ENCOUNTER — Ambulatory Visit: Payer: Self-pay | Admitting: Family Medicine

## 2014-11-26 ENCOUNTER — Ambulatory Visit: Payer: Self-pay | Admitting: Family Medicine

## 2015-02-02 ENCOUNTER — Ambulatory Visit: Payer: Self-pay | Attending: Family Medicine | Admitting: Family Medicine

## 2015-02-02 ENCOUNTER — Encounter: Payer: Self-pay | Admitting: Family Medicine

## 2015-02-02 VITALS — BP 135/80 | HR 82 | Temp 98.5°F | Resp 16 | Ht 64.0 in | Wt 230.0 lb

## 2015-02-02 DIAGNOSIS — M19042 Primary osteoarthritis, left hand: Secondary | ICD-10-CM | POA: Insufficient documentation

## 2015-02-02 DIAGNOSIS — M792 Neuralgia and neuritis, unspecified: Secondary | ICD-10-CM

## 2015-02-02 DIAGNOSIS — M5410 Radiculopathy, site unspecified: Secondary | ICD-10-CM | POA: Insufficient documentation

## 2015-02-02 DIAGNOSIS — M19041 Primary osteoarthritis, right hand: Secondary | ICD-10-CM | POA: Insufficient documentation

## 2015-02-02 DIAGNOSIS — I7300 Raynaud's syndrome without gangrene: Secondary | ICD-10-CM | POA: Insufficient documentation

## 2015-02-02 DIAGNOSIS — M5412 Radiculopathy, cervical region: Secondary | ICD-10-CM | POA: Insufficient documentation

## 2015-02-02 DIAGNOSIS — M79641 Pain in right hand: Secondary | ICD-10-CM

## 2015-02-02 DIAGNOSIS — M79642 Pain in left hand: Secondary | ICD-10-CM

## 2015-02-02 MED ORDER — TRAMADOL HCL 50 MG PO TABS: 50.0000 mg | ORAL_TABLET | Freq: Two times a day (BID) | ORAL | Status: DC | PRN

## 2015-02-02 MED ORDER — PREGABALIN 100 MG PO CAPS: 100.0000 mg | ORAL_CAPSULE | Freq: Three times a day (TID) | ORAL | Status: DC

## 2015-02-02 NOTE — Progress Notes (Signed)
Subjective:  Patient ID: Michelle Stanley, female    DOB: [birth year]  Age: 54 y.o. MRN: [id number]  CC: Osteoarthritis and Skin Discoloration   HPI Michelle Stanley presents for   1. OA: in hands. Has swelling and pain at knuckles. No redness. Taking NSAID  2. Chronic low back pain: with radiculopathy. Taking lyrica, tramadol, diclofenac. Pain interferes with her ability to complete shopping. She is smoking. No falls.   3. R arm pain: pain associated with numbness radiates down R arm to hand. No injury. Some weakness in hand. She is R handed. She is also noticing blue discoloration in 3rd and 4th finger tips of R hands in AM that last for about 8 hrs.   Social History  Substance Use Topics  . Smoking status: Former Smoker -- 1.00 packs/day for 30 years    Types: Cigarettes    Quit date: 07/17/2014  . Smokeless tobacco: Never Used  . Alcohol Use: No   Outpatient Prescriptions Prior to Visit  Medication Sig Dispense Refill  . diclofenac (VOLTAREN) 75 MG EC tablet Take 1 tablet (75 mg total) by mouth 2 (two) times daily. 60 tablet 2  . DULoxetine (CYMBALTA) 30 MG capsule Take 1 capsule (30 mg total) by mouth daily. 30 capsule 2  . Fluticasone-Salmeterol (ADVAIR DISKUS) 500-50 MCG/DOSE AEPB Inhale 1 puff into the lungs 2 (two) times daily. 60 each 0  . gabapentin (NEURONTIN) 300 MG capsule Take 2 capsules (600 mg total) by mouth 4 (four) times daily. 240 capsule 3  . lisinopril (PRINIVIL,ZESTRIL) 10 MG tablet Take 1 tablet (10 mg total) by mouth daily. 30 tablet 1  . loperamide (IMODIUM A-D) 2 MG tablet Take 1 tablet (2 mg total) by mouth 3 (three) times daily before meals. 90 tablet 1  . Tiotropium Bromide Monohydrate (SPIRIVA RESPIMAT) 2.5 MCG/ACT AERS Inhale 2 puffs into the lungs daily. Rinse and gargle after each use. 4 g 0  . pregabalin (LYRICA) 50 MG capsule Take 1 capsule (50 mg total) by mouth 3 (three) times daily. 90 capsule 1  . traMADol (ULTRAM) 50 MG tablet Take 1 tablet  (50 mg total) by mouth at bedtime as needed. 30 tablet 1  . levalbuterol (XOPENEX HFA) 45 MCG/ACT inhaler Inhale 2 puffs into the lungs every 6 (six) hours as needed for wheezing. 1 Inhaler 12   No facility-administered medications prior to visit.    ROS Review of Systems  Constitutional: Negative for fever and chills.  Eyes: Negative for visual disturbance.  Respiratory: Negative for shortness of breath.   Cardiovascular: Negative for chest pain.  Gastrointestinal: Negative for abdominal pain and blood in stool.  Musculoskeletal: Positive for arthralgias. Negative for back pain.  Skin: Positive for color change. Negative for rash.  Allergic/Immunologic: Negative for immunocompromised state.  Hematological: Negative for adenopathy. Does not bruise/bleed easily.  Psychiatric/Behavioral: Negative for suicidal ideas and dysphoric mood.    Objective:  BP 135/80 mmHg  Pulse 82  Temp(Src) 98.5 F (36.9 C) (Oral)  Resp 16  Ht  (1.626 m)  Wt 230 lb (104.327 kg)  BMI 39.46 kg/m2  SpO2 96%  BP/Weight 02/02/2015 10/22/2014 10/15/2014  Systolic BP 135 146 118  Diastolic BP 80 97 72  Wt. (Lbs) 230 230 228  BMI 39.46 39.46 39.12    Physical Exam  Constitutional: She is oriented to person, place, and time. She appears well-developed and well-nourished. No distress.  HENT:  Head: Normocephalic and atraumatic.  Cardiovascular: Normal rate, regular rhythm,  normal heart sounds and intact distal pulses.   Pulses:      Radial pulses are 2+ on the right side, and 2+ on the left side.  Pulmonary/Chest: Effort normal and breath sounds normal.  Musculoskeletal: She exhibits no edema.       Right shoulder: She exhibits decreased range of motion. She exhibits no tenderness, no bony tenderness, no swelling, no effusion, no deformity, no laceration, no pain, no spasm, normal pulse and normal strength.  Neurological: She is alert and oriented to person, place, and time.  Skin: Skin is warm and  dry. No rash noted.  Psychiatric: She has a normal mood and affect.     Assessment & Plan:   Problem List Items Addressed This Visit    Osteoarthritis of both hands (Chronic)   Relevant Medications   traMADol (ULTRAM) 50 MG tablet   Radicular low back pain (Chronic)   Relevant Medications   traMADol (ULTRAM) 50 MG tablet   pregabalin (LYRICA) 100 MG capsule   Radicular pain in right arm - Primary    Other Visit Diagnoses    Bilateral hand pain        Relevant Medications    traMADol (ULTRAM) 50 MG tablet       Meds ordered this encounter  Medications  . traMADol (ULTRAM) 50 MG tablet    Sig: Take 1 tablet (50 mg total) by mouth every 12 (twelve) hours as needed.    Dispense:  60 tablet    Refill:  1  . pregabalin (LYRICA) 100 MG capsule    Sig: Take 1 capsule (100 mg total) by mouth 3 (three) times daily.    Dispense:  90 capsule    Refill:  3    Follow-up: No Follow-up on file.   Dessa Phi MD

## 2015-02-02 NOTE — Patient Instructions (Addendum)
Diagnoses and all orders for this visit:  Radicular pain in right arm  Bilateral hand pain -     traMADol (ULTRAM) 50 MG tablet; Take 1 tablet (50 mg total) by mouth every 12 (twelve) hours as needed.  Primary osteoarthritis of both hands -     traMADol (ULTRAM) 50 MG tablet; Take 1 tablet (50 mg total) by mouth every 12 (twelve) hours as needed.  Radicular low back pain -     pregabalin (LYRICA) 100 MG capsule; Take 1 capsule (100 mg total) by mouth 3 (three) times daily.   F/u in  6-8 week for osteoarthritis and Raynaud's syndrome  Dr. Armen Pickup   Raynaud Phenomenon Raynaud phenomenon is a condition that affects the blood vessels (arteries) that carry blood to your fingers and toes. The arteries that supply blood to your ears or the tip of your nose might also be affected. Raynaud phenomenon causes the arteries to temporarily narrow. As a result, the flow of blood to the affected areas is temporarily decreased. This usually occurs in response to cold temperatures or stress. During an attack, the skin in the affected areas turns white. You may also feel tingling or numbness in those areas. Attacks usually last for only a brief period, and then the blood flow to the area returns to normal. In most cases, Raynaud phenomenon does not cause serious health problems. CAUSES  For many people with this condition, the cause is not known. Raynaud phenomenon is sometimes associated with other diseases, such as scleroderma or lupus. RISK FACTORS Raynaud phenomenon can affect anyone, but it develops most often in people who are 55-48 years old. It affects more females than males. SIGNS AND SYMPTOMS Symptoms of Raynaud phenomenon may occur when you are exposed to cold temperatures or when you have emotional stress. The symptoms may last for a few minutes or up to several hours. They usually affect your fingers but may also affect your toes, ears, or the tip of your nose. Symptoms may include:  Changes  in skin color. The skin in the affected areas will turn pale or white. The skin may then change from white to bluish to red as normal blood flow returns to the area.  Numbness, tingling, or pain in the affected areas. In severe cases, sores may develop in the affected areas.  DIAGNOSIS  Your health care provider will do a physical exam and take your medical history. You may be asked to put your hands in cold water to check for a reaction to cold temperature. Blood tests may be done to check for other diseases or conditions. Your health care provider may also order a test to check the movement of blood through your arteries and veins (vascular ultrasound). TREATMENT  Treatment often involves making lifestyle changes and taking steps to control your exposure to cold temperatures. For more severe cases, medicine (calcium channel blockers) may be used to improve blood flow. Surgery is sometimes done to block the nerves that control the affected arteries, but this is rare. HOME CARE INSTRUCTIONS   Avoid exposure to cold by taking these steps:  If possible, stay indoors during cold weather.  When you go outside during cold weather, dress in layers and wear mittens, a hat, a scarf, and warm footwear.  Wear mittens or gloves when handling ice or frozen food.  Use holders for glasses or cans containing cold drinks.  Let warm water run for a while before taking a shower or bath.  Warm up the  car before driving in cold weather.  If possible, avoid stressful and emotional situations. Exercise, meditation, and yoga may help you cope with stress. Biofeedback may be useful.  Do not use any tobacco products, including cigarettes, chewing tobacco, or electronic cigarettes. If you need help quitting, ask your health care provider.  Avoid secondhand smoke.  Limit your use of caffeine. Switch to decaffeinated coffee, tea, and soda. Avoid chocolate.  Wear loose fitting socks and comfortable, roomy  shoes.  Avoid vibrating tools and machinery.  Take medicines only as directed by your health care provider. SEEK MEDICAL CARE IF:   Your discomfort becomes worse despite lifestyle changes.  You develop sores on your fingers or toes that do not heal.  Your fingers or toes turn black.  You have breaks in the skin on your fingers or toes.  You have a fever.  You have pain or swelling in your joints.  You have a rash.  Your symptoms occur on only one side of your body.   This information is not intended to replace advice given to you by your health care provider. Make sure you discuss any questions you have with your health care provider.   Document Released: 04/07/2000 Document Revised: 05/01/2014 Document Reviewed: 11/13/2013 Elsevier Interactive Patient Education [employer]

## 2015-02-02 NOTE — Progress Notes (Signed)
F/U numbness on 3rt finger, tip of finger stay color blue Pain on rt arm  Pain scale #8 Hx tobacco 4-5 cigarette per day

## 2015-03-01 ENCOUNTER — Ambulatory Visit: Payer: Self-pay | Attending: Family Medicine

## 2015-03-03 ENCOUNTER — Other Ambulatory Visit: Payer: Self-pay | Admitting: Family Medicine

## 2015-03-10 ENCOUNTER — Other Ambulatory Visit: Payer: Self-pay | Admitting: Family Medicine

## 2015-03-10 ENCOUNTER — Other Ambulatory Visit: Payer: Self-pay

## 2015-03-10 DIAGNOSIS — M79641 Pain in right hand: Secondary | ICD-10-CM

## 2015-03-10 DIAGNOSIS — M79642 Pain in left hand: Principal | ICD-10-CM

## 2015-03-10 DIAGNOSIS — M19042 Primary osteoarthritis, left hand: Secondary | ICD-10-CM

## 2015-03-10 DIAGNOSIS — M19041 Primary osteoarthritis, right hand: Secondary | ICD-10-CM

## 2015-03-11 MED ORDER — TRAMADOL HCL 50 MG PO TABS: 50.0000 mg | ORAL_TABLET | Freq: Two times a day (BID) | ORAL | Status: DC | PRN

## 2015-03-12 ENCOUNTER — Telehealth: Payer: Self-pay | Admitting: *Deleted

## 2015-03-12 NOTE — Telephone Encounter (Signed)
Pt aware Rx Tramadol at front office

## 2015-06-07 MED FILL — GABAPENTIN 300 MG CAPSULE: 300 | 30 days supply | Qty: 240 | Fill #2

## 2015-06-11 ENCOUNTER — Encounter: Payer: Self-pay | Admitting: Family Medicine

## 2015-06-11 ENCOUNTER — Ambulatory Visit: Payer: Self-pay | Attending: Family Medicine | Admitting: Family Medicine

## 2015-06-11 VITALS — BP 151/84 | HR 86 | Temp 98.5°F | Resp 16 | Ht 64.0 in | Wt 229.0 lb

## 2015-06-11 DIAGNOSIS — M79641 Pain in right hand: Secondary | ICD-10-CM | POA: Insufficient documentation

## 2015-06-11 DIAGNOSIS — Z87891 Personal history of nicotine dependence: Secondary | ICD-10-CM | POA: Insufficient documentation

## 2015-06-11 DIAGNOSIS — M542 Cervicalgia: Secondary | ICD-10-CM | POA: Insufficient documentation

## 2015-06-11 DIAGNOSIS — M79642 Pain in left hand: Secondary | ICD-10-CM | POA: Insufficient documentation

## 2015-06-11 DIAGNOSIS — F418 Other specified anxiety disorders: Secondary | ICD-10-CM

## 2015-06-11 DIAGNOSIS — K589 Irritable bowel syndrome without diarrhea: Secondary | ICD-10-CM | POA: Insufficient documentation

## 2015-06-11 DIAGNOSIS — R200 Anesthesia of skin: Secondary | ICD-10-CM | POA: Insufficient documentation

## 2015-06-11 DIAGNOSIS — Z0000 Encounter for general adult medical examination without abnormal findings: Secondary | ICD-10-CM

## 2015-06-11 DIAGNOSIS — M19042 Primary osteoarthritis, left hand: Secondary | ICD-10-CM | POA: Insufficient documentation

## 2015-06-11 DIAGNOSIS — F329 Major depressive disorder, single episode, unspecified: Secondary | ICD-10-CM | POA: Insufficient documentation

## 2015-06-11 DIAGNOSIS — F419 Anxiety disorder, unspecified: Secondary | ICD-10-CM | POA: Insufficient documentation

## 2015-06-11 DIAGNOSIS — J410 Simple chronic bronchitis: Secondary | ICD-10-CM | POA: Insufficient documentation

## 2015-06-11 DIAGNOSIS — M545 Low back pain: Secondary | ICD-10-CM | POA: Insufficient documentation

## 2015-06-11 DIAGNOSIS — R42 Dizziness and giddiness: Secondary | ICD-10-CM | POA: Insufficient documentation

## 2015-06-11 DIAGNOSIS — R0602 Shortness of breath: Secondary | ICD-10-CM | POA: Insufficient documentation

## 2015-06-11 DIAGNOSIS — Z79899 Other long term (current) drug therapy: Secondary | ICD-10-CM | POA: Insufficient documentation

## 2015-06-11 DIAGNOSIS — M5412 Radiculopathy, cervical region: Secondary | ICD-10-CM

## 2015-06-11 DIAGNOSIS — Z7982 Long term (current) use of aspirin: Secondary | ICD-10-CM | POA: Insufficient documentation

## 2015-06-11 DIAGNOSIS — I10 Essential (primary) hypertension: Secondary | ICD-10-CM | POA: Insufficient documentation

## 2015-06-11 DIAGNOSIS — Z8673 Personal history of transient ischemic attack (TIA), and cerebral infarction without residual deficits: Secondary | ICD-10-CM | POA: Insufficient documentation

## 2015-06-11 DIAGNOSIS — Z23 Encounter for immunization: Secondary | ICD-10-CM | POA: Insufficient documentation

## 2015-06-11 DIAGNOSIS — J449 Chronic obstructive pulmonary disease, unspecified: Secondary | ICD-10-CM | POA: Insufficient documentation

## 2015-06-11 DIAGNOSIS — M19041 Primary osteoarthritis, right hand: Secondary | ICD-10-CM | POA: Insufficient documentation

## 2015-06-11 DIAGNOSIS — M5410 Radiculopathy, site unspecified: Secondary | ICD-10-CM

## 2015-06-11 LAB — LIPID PANEL
Cholesterol: 203 mg/dL — ABNORMAL HIGH (ref 125–200)
HDL: 42 mg/dL — ABNORMAL LOW (ref >=46)
LDL Cholesterol: 136 mg/dL — ABNORMAL HIGH (ref <130)
TRIGLYCERIDES: 124 mg/dL (ref <150)
Total CHOL/HDL Ratio: 4.8 Ratio (ref <=5.0)
VLDL: 25 mg/dL (ref <30)

## 2015-06-11 LAB — POCT GLYCOSYLATED HEMOGLOBIN (HGB A1C): HEMOGLOBIN A1C: 5.7

## 2015-06-11 MED ORDER — TRAMADOL HCL 50 MG PO TABS: 50.0000 mg | ORAL_TABLET | Freq: Two times a day (BID) | ORAL | Status: DC | PRN

## 2015-06-11 MED ORDER — LISINOPRIL 20 MG PO TABS: 20.0000 mg | ORAL_TABLET | Freq: Every day | ORAL | Status: DC

## 2015-06-11 MED ORDER — FLUTICASONE-SALMETEROL 500-50 MCG/DOSE IN AEPB: 1.0000 | INHALATION_SPRAY | Freq: Two times a day (BID) | RESPIRATORY_TRACT | Status: DC

## 2015-06-11 MED ORDER — DULOXETINE HCL 30 MG PO CPEP: 30.0000 mg | ORAL_CAPSULE | Freq: Every day | ORAL | Status: DC

## 2015-06-11 MED ORDER — GABAPENTIN 300 MG PO CAPS: 600.0000 mg | ORAL_CAPSULE | Freq: Four times a day (QID) | ORAL | Status: DC

## 2015-06-11 MED ORDER — LEVALBUTEROL TARTRATE 45 MCG/ACT IN AERO: 2.0000 | INHALATION_SPRAY | Freq: Four times a day (QID) | RESPIRATORY_TRACT | Status: DC | PRN

## 2015-06-11 MED ORDER — DIAZEPAM 5 MG PO TABS: 5.0000 mg | ORAL_TABLET | Freq: Two times a day (BID) | ORAL | Status: DC | PRN

## 2015-06-11 MED ORDER — TIOTROPIUM BROMIDE MONOHYDRATE 2.5 MCG/ACT IN AERS: 2.0000 | INHALATION_SPRAY | Freq: Every day | RESPIRATORY_TRACT | Status: DC

## 2015-06-11 MED FILL — XOPENEX HFA 45 MCG INHALER: 45 | 30 days supply | Qty: 15 | Fill #0

## 2015-06-11 MED FILL — DULoxetine HCL 30 MG CPEP: 30 | 30 days supply | Qty: 30 | Fill #0

## 2015-06-11 MED FILL — GABAPENTIN 300 MG CAPSULE: 300 | 30 days supply | Qty: 240 | Fill #0

## 2015-06-11 MED FILL — diazePAM 5 MG TABS: 5 | 23 days supply | Qty: 45 | Fill #0

## 2015-06-11 MED FILL — LISINOPRIL 20 MG TABLET: 20 | 30 days supply | Qty: 30 | Fill #0

## 2015-06-11 MED FILL — traMADol HCL 50 MG TABS: 50 | 30 days supply | Qty: 60 | Fill #0

## 2015-06-11 NOTE — Assessment & Plan Note (Signed)
A: BP elevated Med: compliant P: Increase lisinopril from 10 to 20 mg daily

## 2015-06-11 NOTE — Progress Notes (Signed)
C/C numbness on arm and legs, loosing valance x 1 month  Unable to sleep. Medicine refills  Pain scale #8 Tobacco user 1 pack per week No suicidal thoughts in the past two weeks

## 2015-06-11 NOTE — Assessment & Plan Note (Signed)
A; hx of stroke without new focal neurological deficit P: Continue daily aspirin Check lipids with plan to add statin Obtain medical records from last stroke Smoking cessation BP control

## 2015-06-11 NOTE — Assessment & Plan Note (Signed)
A; radicular pain with symptoms worse on L P: Add valium to regimen

## 2015-06-11 NOTE — Patient Instructions (Addendum)
Michelle Stanley was seen today for shortness of breath and numbness.  Diagnoses and all orders for this visit:  Healthcare maintenance -     HgB A1c -     Flu Vaccine QUAD 36+ mos IM  History of stroke -     Lipid Panel  Radicular low back pain -     diazepam (VALIUM) 5 MG tablet; Take 1 tablet (5 mg total) by mouth every 12 (twelve) hours as needed for anxiety. -     gabapentin (NEURONTIN) 300 MG capsule; Take 2 capsules (600 mg total) by mouth 4 (four) times daily.  Cervical radicular pain -     diazepam (VALIUM) 5 MG tablet; Take 1 tablet (5 mg total) by mouth every 12 (twelve) hours as needed for anxiety.  Bilateral hand pain -     traMADol (ULTRAM) 50 MG tablet; Take 1 tablet (50 mg total) by mouth every 12 (twelve) hours as needed.  Primary osteoarthritis of both hands -     traMADol (ULTRAM) 50 MG tablet; Take 1 tablet (50 mg total) by mouth every 12 (twelve) hours as needed.  Anxiety and depression -     DULoxetine (CYMBALTA) 30 MG capsule; Take 1 capsule (30 mg total) by mouth daily.  Simple chronic bronchitis (HCC) -     levalbuterol (XOPENEX HFA) 45 MCG/ACT inhaler; Inhale 2 puffs into the lungs every 6 (six) hours as needed for wheezing. -     Tiotropium Bromide Monohydrate (SPIRIVA RESPIMAT) 2.5 MCG/ACT AERS; Inhale 2 puffs into the lungs daily. Rinse and gargle after each use. -     Fluticasone-Salmeterol (ADVAIR DISKUS) 500-50 MCG/DOSE AEPB; Inhale 1 puff into the lungs 2 (two) times daily.  Essential hypertension -     lisinopril (PRINIVIL,ZESTRIL) 20 MG tablet; Take 1 tablet (20 mg total) by mouth daily.    Sign release for medical records from Dr. Arnoldo Hooker office.  Increase lisinopril to 20 mg daily for HTN Smoking cessation support: smoking cessation hotline: 1-800-QUIT-NOW.  Smoking cessation classes are available through [facility]. Call [phone] or visit our website at [email or web address].  F/u in 3 weeks for radicular pain in arms  and legs  Dr. Armen Pickup

## 2015-06-11 NOTE — Progress Notes (Signed)
Subjective:  Patient ID: Michelle Stanley, female    DOB: March 03, 1961  Age: 55 y.o. MRN: [id number]  CC: Shortness of Breath and Numbness   HPI Michelle Stanley has COPD, smoker, radicular pain in neck and low back   1.  Numbness in arms and legs: x one month. Also feeling of loosing her balance. Has hx of stroke in 2009. No neuroimaging in chart. Her PCP at the time with Dr. Buzzy Han in [place]. Has HTN.  Taking aspirin 325 mg daily. No statin. lisinopril 20 mg daily for HTN. Also has burning down both arms, worse on L arm. Also with pain down both legs and weakness, worse on R. she tried lyrica, but stopped due to spots before eyes and dizziness. She denies facial droop or slurred speech. He numbness, imbalance and weakness come and go depending on her position.   Past Medical History  Diagnosis Date  . Hypertension Dx 2009  . Sciatic nerve pain   . Irritable bowel syndrome Aug 2009  . Sciatic pain   . Stroke [facility]) Aug 2009   Social History  Substance Use Topics  . Smoking status: Former Smoker -- 1.00 packs/day for 30 years    Types: Cigarettes    Quit date: 07/17/2014  . Smokeless tobacco: Never Used  . Alcohol Use: No    Outpatient Prescriptions Prior to Visit  Medication Sig Dispense Refill  . diclofenac (VOLTAREN) 75 MG EC tablet TAKE 1 TABLET BY MOUTH TWICE DAILY 60 tablet 2  . DULoxetine (CYMBALTA) 30 MG capsule Take 1 capsule (30 mg total) by mouth daily. 30 capsule 2  . Fluticasone-Salmeterol (ADVAIR DISKUS) 500-50 MCG/DOSE AEPB Inhale 1 puff into the lungs 2 (two) times daily. 60 each 0  . gabapentin (NEURONTIN) 300 MG capsule Take 2 capsules (600 mg total) by mouth 4 (four) times daily. 240 capsule 3  . levalbuterol (XOPENEX HFA) 45 MCG/ACT inhaler Inhale 2 puffs into the lungs every 6 (six) hours as needed for wheezing. 1 Inhaler 12  . lisinopril (PRINIVIL,ZESTRIL) 10 MG tablet Take 1 tablet (10 mg total) by mouth daily. 30 tablet 1  . loperamide (IMODIUM A-D) 2 MG  tablet Take 1 tablet (2 mg total) by mouth 3 (three) times daily before meals. 90 tablet 1  . pregabalin (LYRICA) 100 MG capsule Take 1 capsule (100 mg total) by mouth 3 (three) times daily. 90 capsule 3  . Tiotropium Bromide Monohydrate (SPIRIVA RESPIMAT) 2.5 MCG/ACT AERS Inhale 2 puffs into the lungs daily. Rinse and gargle after each use. 4 g 0  . traMADol (ULTRAM) 50 MG tablet Take 1 tablet (50 mg total) by mouth every 12 (twelve) hours as needed. 60 tablet 1   No facility-administered medications prior to visit.    ROS Review of Systems  Constitutional: Negative for fever and chills.  Eyes: Negative for visual disturbance.  Respiratory: Negative for shortness of breath.   Cardiovascular: Negative for chest pain.  Gastrointestinal: Negative for abdominal pain and blood in stool.  Musculoskeletal: Positive for back pain, arthralgias, gait problem and neck pain.  Skin: Negative for rash.  Allergic/Immunologic: Negative for immunocompromised state.  Neurological: Positive for dizziness, light-headedness and numbness (in arms and legs ).  Hematological: Negative for adenopathy. Does not bruise/bleed easily.  Psychiatric/Behavioral: Positive for sleep disturbance. Negative for suicidal ideas and dysphoric mood.    Objective:  BP 151/84 mmHg  Pulse 86  Temp(Src) 98.5 F (36.9 C) (Oral)  Resp 16  Ht  (1.626 m)  Wt 229 lb (103.874 kg)  BMI 39.29 kg/m2  SpO2 98%  BP/Weight 06/11/2015 02/02/2015 10/22/2014  Systolic BP 151 135 146  Diastolic BP 84 80 97  Wt. (Lbs) 229 230 230  BMI 39.29 39.46 39.46   Physical Exam  Constitutional: She is oriented to person, place, and time. She appears well-developed and well-nourished. No distress.  HENT:  Head: Normocephalic and atraumatic.  Cardiovascular: Normal rate, regular rhythm, normal heart sounds and intact distal pulses.   Pulmonary/Chest: Effort normal and breath sounds normal.  Musculoskeletal: She exhibits no edema.    Neurological: She is alert and oriented to person, place, and time. She displays normal reflexes. No cranial nerve deficit. She exhibits normal muscle tone. Coordination normal.  Weakness in R leg compared to L Weakness in L arm compared to R   Skin: Skin is warm and dry. No rash noted.  Psychiatric: She has a normal mood and affect.    Lab Results  Component Value Date   HGBA1C 5.70 06/11/2015     Assessment & Plan:   Michelle Stanley was seen today for shortness of breath and numbness.  Diagnoses and all orders for this visit:  Healthcare maintenance -     HgB A1c -     Flu Vaccine QUAD 36+ mos IM  History of stroke -     Lipid Panel  Radicular low back pain -     diazepam (VALIUM) 5 MG tablet; Take 1 tablet (5 mg total) by mouth every 12 (twelve) hours as needed for anxiety. -     gabapentin (NEURONTIN) 300 MG capsule; Take 2 capsules (600 mg total) by mouth 4 (four) times daily.  Cervical radicular pain -     diazepam (VALIUM) 5 MG tablet; Take 1 tablet (5 mg total) by mouth every 12 (twelve) hours as needed for anxiety.  Bilateral hand pain -     traMADol (ULTRAM) 50 MG tablet; Take 1 tablet (50 mg total) by mouth every 12 (twelve) hours as needed.  Primary osteoarthritis of both hands -     traMADol (ULTRAM) 50 MG tablet; Take 1 tablet (50 mg total) by mouth every 12 (twelve) hours as needed.  Anxiety and depression -     DULoxetine (CYMBALTA) 30 MG capsule; Take 1 capsule (30 mg total) by mouth daily.  Simple chronic bronchitis (HCC) -     levalbuterol (XOPENEX HFA) 45 MCG/ACT inhaler; Inhale 2 puffs into the lungs every 6 (six) hours as needed for wheezing. -     Tiotropium Bromide Monohydrate (SPIRIVA RESPIMAT) 2.5 MCG/ACT AERS; Inhale 2 puffs into the lungs daily. Rinse and gargle after each use. -     Fluticasone-Salmeterol (ADVAIR DISKUS) 500-50 MCG/DOSE AEPB; Inhale 1 puff into the lungs 2 (two) times daily.  Essential hypertension -     lisinopril  (PRINIVIL,ZESTRIL) 20 MG tablet; Take 1 tablet (20 mg total) by mouth daily.    No orders of the defined types were placed in this encounter.    Follow-up: No Follow-up on file.   Dessa Phi MD

## 2015-06-11 NOTE — Assessment & Plan Note (Signed)
A; radicular pain with symptoms worse on R P: Add valium to regimen

## 2015-06-13 MED ORDER — ATORVASTATIN CALCIUM 40 MG PO TABS: 40.0000 mg | ORAL_TABLET | Freq: Every day | ORAL | Status: DC

## 2015-06-13 NOTE — Addendum Note (Signed)
Addended by: Dessa Phi on: 06/13/2015 08:43 PM   Modules accepted: Orders

## 2015-06-14 MED FILL — ATORVASTATIN 40 MG TABLET: 40 | 30 days supply | Qty: 30 | Fill #0

## 2015-06-18 ENCOUNTER — Encounter: Payer: Self-pay | Admitting: Clinical

## 2015-06-18 NOTE — Progress Notes (Signed)
Depression screen [facility] 2/9 06/11/2015 02/02/2015 10/22/2014 08/18/2014 08/04/2014  Decreased Interest 2 0 Down, Depressed, Hopeless 2 0 PHQ - 2 Score 4 0 Altered sleeping 3 - Tired, decreased energy 3 - Change in appetite 2 - Feeling bad or failure about yourself  2 - Trouble concentrating 2 - Moving slowly or fidgety/restless 3 - Suicidal thoughts 0 - 0 0 0  PHQ-9 Score 19 - GAD 7 : Generalized Anxiety Score 06/11/2015  Nervous, Anxious, on Edge 3  Control/stop worrying 3  Worry too much - different things 3  Trouble relaxing 3  Restless 3  Easily annoyed or irritable 2  Afraid - awful might happen 1  Total GAD 7 Score 18     \

## 2015-06-22 ENCOUNTER — Telehealth: Payer: Self-pay

## 2015-06-22 NOTE — Telephone Encounter (Signed)
-----   Message from Dessa Phi, MD sent at 06/13/2015  8:42 PM EST ----- Elevated cholesterol, hx of stroke Start lipitor 40 mg daily to lower stroke risk

## 2015-06-22 NOTE — Telephone Encounter (Signed)
CMA called patient, patient was not available to take my call. Left message for patient to return my call asap.

## 2015-06-23 NOTE — Telephone Encounter (Signed)
LVM to return call.

## 2015-06-24 ENCOUNTER — Encounter: Payer: Self-pay | Admitting: Family Medicine

## 2015-06-24 ENCOUNTER — Ambulatory Visit: Payer: Self-pay | Attending: Family Medicine | Admitting: Family Medicine

## 2015-06-24 ENCOUNTER — Telehealth: Payer: Self-pay | Admitting: Family Medicine

## 2015-06-24 ENCOUNTER — Encounter: Payer: Self-pay | Admitting: Clinical

## 2015-06-24 VITALS — BP 148/80 | HR 96 | Temp 98.4°F | Resp 16 | Ht 64.0 in | Wt 230.0 lb

## 2015-06-24 DIAGNOSIS — Z1231 Encounter for screening mammogram for malignant neoplasm of breast: Secondary | ICD-10-CM

## 2015-06-24 DIAGNOSIS — Z1159 Encounter for screening for other viral diseases: Secondary | ICD-10-CM

## 2015-06-24 DIAGNOSIS — Z0000 Encounter for general adult medical examination without abnormal findings: Secondary | ICD-10-CM

## 2015-06-24 DIAGNOSIS — L03113 Cellulitis of right upper limb: Secondary | ICD-10-CM | POA: Insufficient documentation

## 2015-06-24 LAB — CBC
HEMATOCRIT: 41.5 % (ref 36.0–46.0)
Hemoglobin: 13.8 g/dL (ref 12.0–15.0)
MCH: 30.3 pg (ref 26.0–34.0)
MCHC: 33.3 g/dL (ref 30.0–36.0)
MCV: 91.2 fL (ref 78.0–100.0)
MPV: 10.9 fL (ref 8.6–12.4)
PLATELETS: 226 10*3/uL (ref 150–400)
RBC: 4.55 MIL/uL (ref 3.87–5.11)
RDW: 13.5 % (ref 11.5–15.5)
WBC: 12.6 10*3/uL — ABNORMAL HIGH (ref 4.0–10.5)

## 2015-06-24 LAB — HEPATITIS C ANTIBODY: HCV Ab: NEGATIVE

## 2015-06-24 LAB — URIC ACID: Uric Acid, Serum: 6 mg/dL (ref 2.4–7.0)

## 2015-06-24 MED ORDER — SULFAMETHOXAZOLE-TRIMETHOPRIM 800-160 MG PO TABS: 1.0000 | ORAL_TABLET | Freq: Two times a day (BID) | ORAL | Status: DC

## 2015-06-24 MED FILL — SULFAMETHOXAZOLE/TMP DS TAB: 800-160 | 10 days supply | Qty: 20 | Fill #0

## 2015-06-24 NOTE — Telephone Encounter (Signed)
Patient was read doctors note. Patient understood.

## 2015-06-24 NOTE — Progress Notes (Signed)
Subjective:  Patient ID: Michelle Stanley, female    DOB: 06-08-1960  Age: 55 y.o. MRN: [id number]  CC: Arm Swelling   HPI Michelle Stanley smoker, HTN, COPD, hx of CVA present for f/u:    1. R arm redness and swelling: x one week. Redness is spreading. She has hx of cyst. She denies trauma to arm. Skin is dry. She admits to scratching arm. She denies pain in elbow, fever or chills.   2. Anxiety and depression: in setting of chronic pain. Now sleeping 4 hrs per night up from 1.5 hrs. Taking valium.   3. HTN: has increase lisinopril from 10 to 20 mg daily. No HA, CP or SOB.   Social History  Substance Use Topics  . Smoking status: Former Smoker -- 1.00 packs/day for 30 years    Types: Cigarettes    Quit date: 07/17/2014  . Smokeless tobacco: Never Used  . Alcohol Use: No    Outpatient Prescriptions Prior to Visit  Medication Sig Dispense Refill  . aspirin 325 MG tablet Take 325 mg by mouth daily.    Marland Kitchen atorvastatin (LIPITOR) 40 MG tablet Take 1 tablet (40 mg total) by mouth daily. 90 tablet 3  . diazepam (VALIUM) 5 MG tablet Take 1 tablet (5 mg total) by mouth every 12 (twelve) hours as needed for anxiety. 45 tablet 0  . diclofenac (VOLTAREN) 75 MG EC tablet TAKE 1 TABLET BY MOUTH TWICE DAILY 60 tablet 2  . DULoxetine (CYMBALTA) 30 MG capsule Take 1 capsule (30 mg total) by mouth daily. 30 capsule 5  . Fluticasone-Salmeterol (ADVAIR DISKUS) 500-50 MCG/DOSE AEPB Inhale 1 puff into the lungs 2 (two) times daily. 60 each 0  . gabapentin (NEURONTIN) 300 MG capsule Take 2 capsules (600 mg total) by mouth 4 (four) times daily. 240 capsule 3  . levalbuterol (XOPENEX HFA) 45 MCG/ACT inhaler Inhale 2 puffs into the lungs every 6 (six) hours as needed for wheezing. 1 Inhaler 12  . lisinopril (PRINIVIL,ZESTRIL) 20 MG tablet Take 1 tablet (20 mg total) by mouth daily. 30 tablet 5  . Tiotropium Bromide Monohydrate (SPIRIVA RESPIMAT) 2.5 MCG/ACT AERS Inhale 2 puffs into the lungs daily. Rinse and  gargle after each use. 4 g 0  . traMADol (ULTRAM) 50 MG tablet Take 1 tablet (50 mg total) by mouth every 12 (twelve) hours as needed. 60 tablet 1   No facility-administered medications prior to visit.    ROS Review of Systems  Constitutional: Negative for fever and chills.  Eyes: Negative for visual disturbance.  Respiratory: Negative for shortness of breath.   Cardiovascular: Negative for chest pain.  Gastrointestinal: Negative for abdominal pain and blood in stool.  Musculoskeletal: Positive for back pain, arthralgias, gait problem and neck pain.  Skin: Positive for color change. Negative for rash.  Allergic/Immunologic: Negative for immunocompromised state.  Neurological: Positive for dizziness, light-headedness and numbness (in arms and legs ).  Hematological: Negative for adenopathy. Does not bruise/bleed easily.  Psychiatric/Behavioral: Positive for sleep disturbance. Negative for suicidal ideas and dysphoric mood.    Objective:  BP 148/80 mmHg  Pulse 96  Temp(Src) 98.4 F (36.9 C) (Oral)  Resp 16  Ht  (1.626 m)  Wt 230 lb (104.327 kg)  BMI 39.46 kg/m2  SpO2 96%  BP/Weight 06/24/2015 06/11/2015 02/02/2015  Systolic BP 148 151 135  Diastolic BP 80 84 80  Wt. (Lbs) 230 229 230  BMI 39.46 39.29 39.46   Physical Exam  Constitutional: She is  oriented to person, place, and time. She appears well-developed and well-nourished. No distress.  HENT:  Head: Normocephalic and atraumatic.  Cardiovascular: Normal rate, regular rhythm, normal heart sounds and intact distal pulses.   Pulmonary/Chest: Effort normal and breath sounds normal.  Musculoskeletal: She exhibits no edema.       Right elbow: Normal.She exhibits normal range of motion. No tenderness found.  Lymphadenopathy:    She has no axillary adenopathy.  Neurological: She is alert and oriented to person, place, and time.  Skin: Skin is warm and dry. No rash noted.     Psychiatric: She has a normal mood and  affect.   [facility] 06/24/2015 06/11/2015 02/02/2015  Decreased Interest 2 2 0  Down, Depressed, Hopeless 2 2 0  PHQ - 2 Score 4 4 0  Altered sleeping 1 3 -  Tired, decreased energy 2 3 -  Change in appetite 2 2 -  Feeling bad or failure about yourself  2 2 -  Trouble concentrating 2 2 -  Moving slowly or fidgety/restless 3 3 -  Suicidal thoughts 0 0 -  PHQ-9 Score 16 19 -   GAD 7 : Generalized Anxiety Score 06/24/2015 06/11/2015  Nervous, Anxious, on Edge 2 3  Control/stop worrying 2 3  Worry too much - different things 2 3  Trouble relaxing 2 3  Restless 1 3  Easily annoyed or irritable 2 2  Afraid - awful might happen 2 1  Total GAD 7 Score 13 18      Assessment & Plan:  Michelle Stanley was seen today for arm swelling.  Diagnoses and all orders for this visit:  Cellulitis of left arm -     sulfamethoxazole-trimethoprim (BACTRIM DS,SEPTRA DS) 800-160 MG tablet; Take 1 tablet by mouth 2 (two) times daily. -     CBC -     Uric acid  Need for hepatitis C screening test -     Hepatitis C antibody, reflex  Healthcare maintenance -     Ambulatory referral to Gastroenterology  Visit for screening mammogram -     MM DIGITAL SCREENING BILATERAL; Future   Michelle Stanley was seen today for arm swelling.  Diagnoses and all orders for this visit:  Cellulitis of left arm -     sulfamethoxazole-trimethoprim (BACTRIM DS,SEPTRA DS) 800-160 MG tablet; Take 1 tablet by mouth 2 (two) times daily. -     CBC -     Uric acid  Need for hepatitis C screening test -     Hepatitis C antibody, reflex  Healthcare maintenance -     Ambulatory referral to Gastroenterology  Visit for screening mammogram -     MM DIGITAL SCREENING BILATERAL; Future   Follow-up: No Follow-up on file.   Dessa Phi MD

## 2015-06-24 NOTE — Progress Notes (Signed)
Depression screen [facility] 2/9 06/24/2015 06/11/2015 02/02/2015 10/22/2014 08/18/2014  Decreased Interest 2 2 0 3 2  Down, Depressed, Hopeless 2 2 0 3 2  PHQ - 2 Score 4 4 0 6 4  Altered sleeping 1 3 - 3 2  Tired, decreased energy 2 3 - 3 3  Change in appetite 2 2 - 2 3  Feeling bad or failure about yourself  2 2 - 2 2  Trouble concentrating 2 2 - 2 2  Moving slowly or fidgety/restless 3 3 - 3 2  Suicidal thoughts 0 0 - 0 0  PHQ-9 Score 16 19 - 21 18    GAD 7 : Generalized Anxiety Score 06/24/2015 06/11/2015  Nervous, Anxious, on Edge 2 3  Control/stop worrying 2 3  Worry too much - different things 2 3  Trouble relaxing 2 3  Restless 1 3  Easily annoyed or irritable 2 2  Afraid - awful might happen 2 1  Total GAD 7 Score 13 18

## 2015-06-24 NOTE — Progress Notes (Signed)
Rt elbow red and worm to the touch Cyst vs Bug bite  Pain scale #6 Tobacco user 2 every 3 days  No suicidal thoughts in the past two weeks

## 2015-06-24 NOTE — Assessment & Plan Note (Signed)
A: L arm cellulitis, possibly following scratching. No fever. No elbow joint involvement. No fluctuance to suggest abscess. No crepitus to suggest gas forming organisms.  P: Bactrim CBC Uric acid  Close f/u

## 2015-06-24 NOTE — Patient Instructions (Addendum)
Kylen was seen today for cyst.  Diagnoses and all orders for this visit:  Cellulitis of left arm -     sulfamethoxazole-trimethoprim (BACTRIM DS,SEPTRA DS) 800-160 MG tablet; Take 1 tablet by mouth 2 (two) times daily. -     CBC -     Uric acid  Need for hepatitis C screening test -     Hepatitis C antibody, reflex   Keep f/u on 07/05/2015  Dr. Armen Pickup

## 2015-06-30 NOTE — Telephone Encounter (Signed)
Date of birth verified by pt  La results given Continue with Bactrim  Pt verbalized understanding

## 2015-06-30 NOTE — Telephone Encounter (Signed)
-----   Message from Dessa PhiJosalyn Funches, MD sent at 06/25/2015  8:23 AM EST ----- Hep C screen neg Uric acid normal, no gout WBC elevated consistent with cellulitis, continue bactrim

## 2015-07-05 ENCOUNTER — Encounter: Payer: Self-pay | Admitting: Family Medicine

## 2015-07-05 ENCOUNTER — Ambulatory Visit: Payer: Self-pay | Attending: Family Medicine | Admitting: Family Medicine

## 2015-07-05 VITALS — BP 142/84 | HR 77 | Temp 97.7°F | Resp 18 | Ht 64.0 in | Wt 228.0 lb

## 2015-07-05 DIAGNOSIS — Z7982 Long term (current) use of aspirin: Secondary | ICD-10-CM | POA: Insufficient documentation

## 2015-07-05 DIAGNOSIS — F419 Anxiety disorder, unspecified: Secondary | ICD-10-CM | POA: Insufficient documentation

## 2015-07-05 DIAGNOSIS — L03113 Cellulitis of right upper limb: Secondary | ICD-10-CM

## 2015-07-05 DIAGNOSIS — F418 Other specified anxiety disorders: Secondary | ICD-10-CM

## 2015-07-05 DIAGNOSIS — Z23 Encounter for immunization: Secondary | ICD-10-CM

## 2015-07-05 DIAGNOSIS — Z79899 Other long term (current) drug therapy: Secondary | ICD-10-CM | POA: Insufficient documentation

## 2015-07-05 DIAGNOSIS — F329 Major depressive disorder, single episode, unspecified: Secondary | ICD-10-CM | POA: Insufficient documentation

## 2015-07-05 DIAGNOSIS — J449 Chronic obstructive pulmonary disease, unspecified: Secondary | ICD-10-CM | POA: Insufficient documentation

## 2015-07-05 DIAGNOSIS — F32A Depression, unspecified: Secondary | ICD-10-CM

## 2015-07-05 DIAGNOSIS — I10 Essential (primary) hypertension: Secondary | ICD-10-CM

## 2015-07-05 DIAGNOSIS — Z0000 Encounter for general adult medical examination without abnormal findings: Secondary | ICD-10-CM

## 2015-07-05 DIAGNOSIS — F17210 Nicotine dependence, cigarettes, uncomplicated: Secondary | ICD-10-CM | POA: Insufficient documentation

## 2015-07-05 DIAGNOSIS — M7989 Other specified soft tissue disorders: Secondary | ICD-10-CM | POA: Insufficient documentation

## 2015-07-05 DIAGNOSIS — Z8673 Personal history of transient ischemic attack (TIA), and cerebral infarction without residual deficits: Secondary | ICD-10-CM | POA: Insufficient documentation

## 2015-07-05 MED ORDER — LISINOPRIL 40 MG PO TABS: 40.0000 mg | ORAL_TABLET | Freq: Every day | ORAL | Status: DC

## 2015-07-05 MED ORDER — DULOXETINE HCL 60 MG PO CPEP: 60.0000 mg | ORAL_CAPSULE | Freq: Every day | ORAL | Status: DC

## 2015-07-05 MED FILL — LISINOPRIL 40 MG TABLET: 40 | 30 days supply | Qty: 30 | Fill #0

## 2015-07-05 MED FILL — DULoxetine HCL 60 MG CPEP: 60 | 30 days supply | Qty: 30 | Fill #0

## 2015-07-05 NOTE — Assessment & Plan Note (Signed)
A: persistent P: Increase cymbalta to 60 mg daily

## 2015-07-05 NOTE — Patient Instructions (Addendum)
Jasmine DecemberSharon was seen today for follow-up.  Diagnoses and all orders for this visit:  Anxiety and depression -     DULoxetine (CYMBALTA) 60 MG capsule; Take 1 capsule (60 mg total) by mouth daily.  Essential hypertension -     lisinopril (PRINIVIL,ZESTRIL) 40 MG tablet; Take 1 tablet (40 mg total) by mouth daily.  Other orders -     Tdap vaccine greater than or equal to 55yo IM  You did pick up the cymbalta We can increase the dose to 60 mg daily starting today.  Your R elbow is looking better. Ice or heat as needed for pain. Continue to cushion.   BP improved. Goal is < 140/90. Will increase lisinopril further to 40 mg daily  F/u in 3-4 weeks for pap and R elbow recheck  Dr. Armen PickupFunches

## 2015-07-05 NOTE — Assessment & Plan Note (Signed)
A: improving P: Reassurance Monitoring

## 2015-07-05 NOTE — Assessment & Plan Note (Signed)
A; BP elevated P; Increase lisinopril to 40 mg daily

## 2015-07-05 NOTE — Progress Notes (Signed)
F/U cyst on rt elbow  No redness, elbow still swelling and pain Pain scale #5 Tobacco user- 3 cigarette per day No suicidal thoughts in the past two weeks

## 2015-07-05 NOTE — Progress Notes (Signed)
Patient ID: Michelle Stanley, female   DOB: 1960-07-18, 55 y.o.   MRN: [id number]   Subjective:  Patient ID: Michelle ClamSharon R Sinagra, female    DOB: 1960-07-18  Age: 55 y.o. MRN: [id number]  CC: Follow-up   HPI Michelle ClamSharon R Visscher smoker, HTN, COPD, hx of CVA present for f/u:    1. R arm redness and swelling: improving. She finished bactrim. Area is still firm and slightly tender. No fever or chills.  Redness has resolved.  2. Anxiety and depression: in setting of chronic pain. Now sleeping 4 hrs per night up from 1.5 hrs. Taking valium. Taking cymbalta 30 mg daily. Denies SI. Mood is most affected by her chronic back pain and lack of income.   3. HTN: has increase lisinopril from 10 to 20 mg daily. No HA, CP or SOB.   Social History  Substance Use Topics  . Smoking status: Former Smoker -- 1.00 packs/day for 30 years    Types: Cigarettes    Quit date: 07/17/2014  . Smokeless tobacco: Never Used  . Alcohol Use: No    Outpatient Prescriptions Prior to Visit  Medication Sig Dispense Refill  . aspirin 325 MG tablet Take 325 mg by mouth daily.    Marland Kitchen. atorvastatin (LIPITOR) 40 MG tablet Take 1 tablet (40 mg total) by mouth daily. 90 tablet 3  . diazepam (VALIUM) 5 MG tablet Take 1 tablet (5 mg total) by mouth every 12 (twelve) hours as needed for anxiety. 45 tablet 0  . diclofenac (VOLTAREN) 75 MG EC tablet TAKE 1 TABLET BY MOUTH TWICE DAILY 60 tablet 2  . Fluticasone-Salmeterol (ADVAIR DISKUS) 500-50 MCG/DOSE AEPB Inhale 1 puff into the lungs 2 (two) times daily. 60 each 0  . gabapentin (NEURONTIN) 300 MG capsule Take 2 capsules (600 mg total) by mouth 4 (four) times daily. 240 capsule 3  . levalbuterol (XOPENEX HFA) 45 MCG/ACT inhaler Inhale 2 puffs into the lungs every 6 (six) hours as needed for wheezing. 1 Inhaler 12  . lisinopril (PRINIVIL,ZESTRIL) 20 MG tablet Take 1 tablet (20 mg total) by mouth daily. 30 tablet 5  . Tiotropium Bromide Monohydrate (SPIRIVA RESPIMAT) 2.5 MCG/ACT AERS Inhale 2  puffs into the lungs daily. Rinse and gargle after each use. 4 g 0  . traMADol (ULTRAM) 50 MG tablet Take 1 tablet (50 mg total) by mouth every 12 (twelve) hours as needed. 60 tablet 1  . DULoxetine (CYMBALTA) 30 MG capsule Take 1 capsule (30 mg total) by mouth daily. (Patient not taking: Reported on 07/05/2015) 30 capsule 5  . sulfamethoxazole-trimethoprim (BACTRIM DS,SEPTRA DS) 800-160 MG tablet Take 1 tablet by mouth 2 (two) times daily. (Patient not taking: Reported on 07/05/2015) 20 tablet 0   No facility-administered medications prior to visit.    ROS Review of Systems  Constitutional: Negative for fever and chills.  Eyes: Negative for visual disturbance.  Respiratory: Negative for shortness of breath.   Cardiovascular: Negative for chest pain.  Gastrointestinal: Negative for abdominal pain and blood in stool.  Musculoskeletal: Positive for back pain, arthralgias, gait problem and neck pain.  Skin: Positive for color change. Negative for rash.  Allergic/Immunologic: Negative for immunocompromised state.  Neurological: Positive for dizziness, light-headedness and numbness (in arms and legs ).  Hematological: Negative for adenopathy. Does not bruise/bleed easily.  Psychiatric/Behavioral: Positive for sleep disturbance. Negative for suicidal ideas and dysphoric mood.    Objective:  BP 142/84 mmHg  Pulse 77  Temp(Src) 97.7 F (36.5 C) (Oral)  Resp 18  Ht  (1.626 m)  Wt 228 lb (103.42 kg)  BMI 39.12 kg/m2  SpO2 97%  BP/Weight 07/05/2015 06/24/2015 06/11/2015  Systolic BP 142 148 151  Diastolic BP 84 80 84  Wt. (Lbs) 228 230 229  BMI 39.12 39.46 39.29   Physical Exam  Constitutional: She is oriented to person, place, and time. She appears well-developed and well-nourished. No distress.  HENT:  Head: Normocephalic and atraumatic.  Cardiovascular: Normal rate, regular rhythm, normal heart sounds and intact distal pulses.   Pulmonary/Chest: Effort normal and breath sounds  normal.  Musculoskeletal: She exhibits no edema.       Right elbow: Normal.She exhibits normal range of motion. No tenderness found.  Lymphadenopathy:    She has no axillary adenopathy.  Neurological: She is alert and oriented to person, place, and time.  Skin: Skin is warm and dry. No rash noted.     Psychiatric: She has a normal mood and affect.   Depression screen [facility] 2/9 07/05/2015 06/24/2015 06/11/2015  Decreased Interest Down, Depressed, Hopeless PHQ - 2 Score Altered sleeping Tired, decreased energy Change in appetite Feeling bad or failure about yourself  Trouble concentrating Moving slowly or fidgety/restless Suicidal thoughts 0 0 0  PHQ-9 Score GAD 7 : Generalized Anxiety Score 07/05/2015 06/24/2015 06/11/2015  Nervous, Anxious, on Edge Control/stop worrying Worry too much - different things Trouble relaxing Restless Easily annoyed or irritable Afraid - awful might happen Total GAD 7 Score Assessment & Plan:  Nyx was seen today for follow-up.  Diagnoses and all orders for this visit:  Anxiety and depression -     DULoxetine (CYMBALTA) 60 MG capsule; Take 1 capsule (60 mg total) by mouth daily.  Essential hypertension -     lisinopril (PRINIVIL,ZESTRIL) 40 MG tablet; Take 1 tablet (40 mg total) by mouth daily.  Other orders -     Tdap vaccine greater than or equal to 7yo IM   There are no diagnoses linked to this encounter. Follow-up: No Follow-up on file.   Dessa Phi MD

## 2015-07-06 ENCOUNTER — Ambulatory Visit: Payer: Self-pay

## 2015-07-09 ENCOUNTER — Encounter: Payer: Self-pay | Admitting: Clinical

## 2015-07-09 NOTE — Progress Notes (Signed)
[facility] 2/9 07/05/2015 06/24/2015 06/11/2015 02/02/2015 10/22/2014  Decreased Interest 1 2 2  0 3  Down, Depressed, Hopeless 2 2 2  0 3  PHQ - 2 Score 3 4 4  0 6  Altered sleeping 2 1 3  - 3  Tired, decreased energy 2 2 3  - 3  Change in appetite 2 2 2  - 2  Feeling bad or failure about yourself  2 2 2  - 2  Trouble concentrating 2 2 2  - 2  Moving slowly or fidgety/restless 2 3 3  - 3  Suicidal thoughts 0 0 0 - 0  PHQ-9 Score 15 16 19  - 21    GAD 7 : Generalized Anxiety Score 07/05/2015 06/24/2015 06/11/2015  Nervous, Anxious, on Edge 2 2 3   Control/stop worrying 3 2 3   Worry too much - different things 3 2 3   Trouble relaxing 2 2 3   Restless 2 1 3   Easily annoyed or irritable 2 2 2   Afraid - awful might happen 3 2 1   Total GAD 7 Score 17 13 18

## 2015-07-12 ENCOUNTER — Ambulatory Visit: Payer: Self-pay | Attending: Family Medicine | Admitting: Clinical

## 2015-07-12 DIAGNOSIS — F418 Other specified anxiety disorders: Secondary | ICD-10-CM

## 2015-07-12 DIAGNOSIS — F32A Depression, unspecified: Secondary | ICD-10-CM

## 2015-07-12 DIAGNOSIS — F419 Anxiety disorder, unspecified: Principal | ICD-10-CM

## 2015-07-12 DIAGNOSIS — F329 Major depressive disorder, single episode, unspecified: Secondary | ICD-10-CM

## 2015-07-12 NOTE — Progress Notes (Signed)
ASSESSMENT: Pt currently experiencing symptoms of anxiety and depression. [facility]; pt would benefit from psychoeducation and brief therapeutic interventions regarding coping with symptoms of anxiety, depression, as well as chronic pain. Stage of Change: precontemplative  PLAN: 1. F/U with behavioral health consultant in one month 2. Psychiatric Medications: Valium, Cymbalta. 3. Behavioral recommendation(s):   -Consider daily relaxation breathing excercises, as practiced in office visit -Consider reading educational materials regarding coping with anxiety, depression, and chronic pain -Consider [facility] for support for women who have been out of workforce for many years, as social support -Consider going to social security office to begin disability process SUBJECTIVE: Pt. referred by Dr Armen PickupFunches for symptoms of anxiety and depression:  Pt. reports the following symptoms/concerns: Pt states that life has become difficult since stroke in 2009; increase in feelings of depression, anxiety and chronic pain since stroke. Pt says she isolates herself from others, feels she has no control over the physical pain; there is nothing she knows that helps with the pain, nor to ease feelings of anxiety and depression. Duration of problem: 7 years Severity: moderate  OBJECTIVE: Orientation & Cognition: Oriented x3. Thought processes normal and appropriate to situation. Mood: teary, irritable Affect: appropriate Appearance: appropriate Risk of harm to self or others: no known risk of harm to self or others Substance use: tobacco Assessments administered: none  Diagnosis: Anxiety and depression CPT Code: F41.8 -------------------------------------------- Other(s) present in the room: none  Time spent with patient in exam room: 30 minutes, 11:00-11:30am   [facility] 07/05/2015 06/24/2015 06/11/2015 02/02/2015 10/22/2014  Decreased Interest 1 2 2  0 3   Down, Depressed, Hopeless 2 2 2  0 3  PHQ - 2 Score 3 4 4  0 6  Altered sleeping 2 1 3  - 3  Tired, decreased energy 2 2 3  - 3  Change in appetite 2 2 2  - 2  Feeling bad or failure about yourself  2 2 2  - 2  Trouble concentrating 2 2 2  - 2  Moving slowly or fidgety/restless 2 3 3  - 3  Suicidal thoughts 0 0 0 - 0  PHQ-9 Score 15 16 19  - 21    GAD 7 : Generalized Anxiety Score 07/05/2015 06/24/2015 06/11/2015  Nervous, Anxious, on Edge 2 2 3   Control/stop worrying 3 2 3   Worry too much - different things 3 2 3   Trouble relaxing 2 2 3   Restless 2 1 3   Easily annoyed or irritable 2 2 2   Afraid - awful might happen 3 2 1   Total GAD 7 Score 17 13 18

## 2015-07-22 ENCOUNTER — Other Ambulatory Visit: Payer: Self-pay | Admitting: Family Medicine

## 2015-07-22 ENCOUNTER — Ambulatory Visit: Payer: Self-pay

## 2015-07-22 NOTE — Telephone Encounter (Signed)
Per PCP notes from office visit this month, Cymbalta was increase and there was no mention of Diazepam; this will need to be addressed by PCP

## 2015-07-23 MED FILL — traMADol HCL 50 MG TABS: 50 | 30 days supply | Qty: 60 | Fill #1

## 2015-07-26 ENCOUNTER — Telehealth: Payer: Self-pay | Admitting: Family Medicine

## 2015-07-26 DIAGNOSIS — M5412 Radiculopathy, cervical region: Secondary | ICD-10-CM

## 2015-07-26 DIAGNOSIS — M5410 Radiculopathy, site unspecified: Secondary | ICD-10-CM

## 2015-07-26 MED ORDER — DIAZEPAM 5 MG PO TABS: 5.0000 mg | ORAL_TABLET | Freq: Two times a day (BID) | ORAL | Status: DC | PRN

## 2015-07-26 NOTE — Telephone Encounter (Signed)
Refilled diazepam

## 2015-07-26 NOTE — Telephone Encounter (Signed)
Pt notified Rx at front office ready to be pickup 

## 2015-08-09 ENCOUNTER — Ambulatory Visit: Payer: Self-pay

## 2015-08-09 ENCOUNTER — Telehealth: Payer: Self-pay | Admitting: Clinical

## 2015-08-09 NOTE — Telephone Encounter (Signed)
Termination w pt; pt is [facility] to end of April, as needed

## 2015-08-19 MED FILL — GABAPENTIN 300 MG CAPSULE: 300 | 30 days supply | Qty: 240 | Fill #1

## 2015-08-23 ENCOUNTER — Ambulatory Visit: Payer: Self-pay | Attending: Family Medicine

## 2015-08-24 ENCOUNTER — Ambulatory Visit: Payer: Self-pay

## 2015-09-06 ENCOUNTER — Other Ambulatory Visit: Payer: Self-pay | Admitting: Family Medicine

## 2015-09-06 DIAGNOSIS — M5410 Radiculopathy, site unspecified: Secondary | ICD-10-CM

## 2015-09-06 DIAGNOSIS — M5412 Radiculopathy, cervical region: Secondary | ICD-10-CM

## 2015-09-06 MED FILL — ATORVASTATIN 40 MG TABLET: 40 | 30 days supply | Qty: 30 | Fill #1

## 2015-09-06 NOTE — Telephone Encounter (Signed)
Tramadol refilled OV needed for additional refills  

## 2015-09-06 NOTE — Telephone Encounter (Signed)
LVM Rx at front office ready to be pickup 

## 2015-09-07 ENCOUNTER — Other Ambulatory Visit: Payer: Self-pay | Admitting: Family Medicine

## 2015-09-07 MED FILL — traMADol HCL 50 MG TABS: 50 | 30 days supply | Qty: 60 | Fill #0

## 2015-09-29 MED FILL — GABAPENTIN 300 MG CAPSULE: 300 | 30 days supply | Qty: 240 | Fill #2

## 2015-10-12 ENCOUNTER — Encounter: Payer: Self-pay | Admitting: Family Medicine

## 2015-10-12 ENCOUNTER — Other Ambulatory Visit: Payer: Self-pay | Admitting: Family Medicine

## 2015-10-12 ENCOUNTER — Ambulatory Visit: Payer: Self-pay | Attending: Family Medicine | Admitting: Family Medicine

## 2015-10-12 VITALS — BP 138/82 | HR 88 | Temp 98.5°F | Resp 16 | Ht 64.0 in | Wt 224.0 lb

## 2015-10-12 DIAGNOSIS — M545 Low back pain: Secondary | ICD-10-CM | POA: Insufficient documentation

## 2015-10-12 DIAGNOSIS — M5412 Radiculopathy, cervical region: Secondary | ICD-10-CM

## 2015-10-12 DIAGNOSIS — M542 Cervicalgia: Secondary | ICD-10-CM | POA: Insufficient documentation

## 2015-10-12 DIAGNOSIS — E559 Vitamin D deficiency, unspecified: Secondary | ICD-10-CM

## 2015-10-12 DIAGNOSIS — M5410 Radiculopathy, site unspecified: Secondary | ICD-10-CM

## 2015-10-12 LAB — CBC
HEMATOCRIT: 42.2 % (ref 35.0–45.0)
HEMOGLOBIN: 13.9 g/dL (ref 11.7–15.5)
MCH: 29.8 pg (ref 27.0–33.0)
MCHC: 32.9 g/dL (ref 32.0–36.0)
MCV: 90.6 fL (ref 80.0–100.0)
MPV: 11.6 fL (ref 7.5–12.5)
Platelets: 223 10*3/uL (ref 140–400)
RBC: 4.66 MIL/uL (ref 3.80–5.10)
RDW: 14 % (ref 11.0–15.0)
WBC: 8.9 10*3/uL (ref 3.8–10.8)

## 2015-10-12 LAB — VITAMIN B12: Vitamin B-12: 292 pg/mL (ref 200–1100)

## 2015-10-12 LAB — TSH: TSH: 2.03 mIU/L

## 2015-10-12 MED ORDER — DIAZEPAM 5 MG PO TABS: 5.0000 mg | ORAL_TABLET | Freq: Two times a day (BID) | ORAL | Status: DC | PRN

## 2015-10-12 MED ORDER — TRAMADOL HCL 50 MG PO TABS: 50.0000 mg | ORAL_TABLET | Freq: Two times a day (BID) | ORAL | Status: DC | PRN

## 2015-10-12 MED ORDER — PREDNISONE 20 MG PO TABS: ORAL_TABLET | ORAL | Status: DC

## 2015-10-12 MED FILL — traMADol HCL 50 MG TABS: 50 | 30 days supply | Qty: 60 | Fill #0

## 2015-10-12 MED FILL — predniSONE 20 MG TABS: 20 | 15 days supply | Qty: 24 | Fill #0

## 2015-10-12 NOTE — Patient Instructions (Addendum)
Diagnoses and all orders for this visit:  Cervical radicular pain -     Vitamin B12 -     Vitamin D, 25-hydroxy -     CBC -     TSH -     MR Cervical Spine Wo Contrast; Future -     traMADol (ULTRAM) 50 MG tablet; Take 1 tablet (50 mg total) by mouth every 12 (twelve) hours as needed. -     diazepam (VALIUM) 5 MG tablet; Take 1 tablet (5 mg total) by mouth every 12 (twelve) hours as needed for anxiety. -     predniSONE (DELTASONE) 20 MG tablet; Take every morning with food 60 mg daily for 3 days, 40 mg daily for 3 days, 30 mg daily for 3 days, 20 mg daily for 3 days, 10 mg daily for 3 days then STOP  Radicular low back pain -     Vitamin B12 -     Vitamin D, 25-hydroxy -     CBC -     TSH -     MR Lumbar Spine Wo Contrast; Future -     traMADol (ULTRAM) 50 MG tablet; Take 1 tablet (50 mg total) by mouth every 12 (twelve) hours as needed. -     diazepam (VALIUM) 5 MG tablet; Take 1 tablet (5 mg total) by mouth every 12 (twelve) hours as needed for anxiety. -     predniSONE (DELTASONE) 20 MG tablet; Take every morning with food 60 mg daily for 3 days, 40 mg daily for 3 days, 30 mg daily for 3 days, 20 mg daily for 3 days, 10 mg daily for 3 days then STOP    F/u in 4 weeks for back and neck pain , you will get a call after the MRI is reviewed   Dr. Armen PickupFunches

## 2015-10-12 NOTE — Assessment & Plan Note (Signed)
Worsening symptoms  Plan: Course of steroids Repeat MRI Check vit D and vit B12, TSH, CBC Plan for neurosurg referral after reviewing MRI  

## 2015-10-12 NOTE — Assessment & Plan Note (Signed)
Worsening symptoms  Plan: Course of steroids Repeat MRI Check vit D and vit B12, TSH, CBC Plan for neurosurg referral after reviewing MRI

## 2015-10-12 NOTE — Progress Notes (Signed)
C/C numbness an pain on arm and legs  Unable to walk long walk with out pain  Pain scale 10 Former tobacco user  No suicidal thoughts in the past two weeks

## 2015-10-12 NOTE — Progress Notes (Signed)
Subjective:  Patient ID: Michelle Stanley, female    DOB: October 11, 1960  Age: 55 y.o. MRN: [id number]  CC: Back Pain and Neck Pain   HPI Michelle Stanley presents for    1. Chronic back: chronic pain with radicular symptoms that are worsening. Pain is worse on R side. Also with numbness down R leg. No falls. She walks with a cane. Tramadol does help reduce pain. Valium does help with sleep. Gabapentin does help with numbness. She has a lumbar MRI in 2014 that revealed spinal stenosis at L4-L5.   2. Neck pain: chronic. Also worse on R side. Numbness and burning sensation that starts from neck down to hand. Pain limits range of motion.   Social History  Substance Use Topics  . Smoking status: Former Smoker -- 1.00 packs/day for 30 years    Types: Cigarettes    Quit date: 07/17/2014  . Smokeless tobacco: Never Used  . Alcohol Use: No    Outpatient Prescriptions Prior to Visit  Medication Sig Dispense Refill  . aspirin 325 MG tablet Take 325 mg by mouth daily.    Marland Kitchen atorvastatin (LIPITOR) 40 MG tablet Take 1 tablet (40 mg total) by mouth daily. 90 tablet 3  . diazepam (VALIUM) 5 MG tablet Take 1 tablet (5 mg total) by mouth every 12 (twelve) hours as needed for anxiety. 45 tablet 0  . diclofenac (VOLTAREN) 75 MG EC tablet TAKE 1 TABLET BY MOUTH TWICE DAILY 60 tablet 2  . DULoxetine (CYMBALTA) 60 MG capsule Take 1 capsule (60 mg total) by mouth daily. 30 capsule 5  . Fluticasone-Salmeterol (ADVAIR DISKUS) 500-50 MCG/DOSE AEPB Inhale 1 puff into the lungs 2 (two) times daily. 60 each 0  . gabapentin (NEURONTIN) 300 MG capsule Take 2 capsules (600 mg total) by mouth 4 (four) times daily. 240 capsule 3  . levalbuterol (XOPENEX HFA) 45 MCG/ACT inhaler Inhale 2 puffs into the lungs every 6 (six) hours as needed for wheezing. 1 Inhaler 12  . lisinopril (PRINIVIL,ZESTRIL) 40 MG tablet Take 1 tablet (40 mg total) by mouth daily. 30 tablet 5  . Tiotropium Bromide Monohydrate (SPIRIVA RESPIMAT) 2.5  MCG/ACT AERS Inhale 2 puffs into the lungs daily. Rinse and gargle after each use. 4 g 0  . traMADol (ULTRAM) 50 MG tablet TAKE 1 TABLET BY MOUTH EVERY 12 HOURS AS NEEDED 60 tablet 0   No facility-administered medications prior to visit.    ROS Review of Systems  Constitutional: Negative for fever and chills.  Eyes: Negative for visual disturbance.  Respiratory: Negative for shortness of breath.   Cardiovascular: Negative for chest pain.  Gastrointestinal: Negative for abdominal pain and blood in stool.  Musculoskeletal: Positive for back pain, arthralgias, gait problem and neck pain.  Skin: Positive for color change. Negative for rash.  Allergic/Immunologic: Negative for immunocompromised state.  Neurological: Positive for dizziness, light-headedness and numbness (in arms and legs ).  Hematological: Negative for adenopathy. Does not bruise/bleed easily.  Psychiatric/Behavioral: Positive for sleep disturbance. Negative for suicidal ideas and dysphoric mood.    Objective:  BP 138/82 mmHg  Pulse 88  Temp(Src) 98.5 F (36.9 C) (Oral)  Resp 16  Ht  (1.626 m)  Wt 224 lb (101.606 kg)  BMI 38.43 kg/m2  SpO2 95%  BP/Weight 10/12/2015 07/05/2015 06/24/2015  Systolic BP 138 142 148  Diastolic BP 82 84 80  Wt. (Lbs) 224 228 230  BMI 38.43 39.12 39.46   Physical Exam  Constitutional: She is oriented to  person, place, and time. She appears well-developed and well-nourished. No distress.  HENT:  Head: Normocephalic and atraumatic.  Neck: Decreased range of motion present.  Cardiovascular: Normal rate, regular rhythm, normal heart sounds and intact distal pulses.   Pulmonary/Chest: Effort normal and breath sounds normal.  Musculoskeletal: She exhibits no edema.  Lymphadenopathy:    She has no cervical adenopathy.  Neurological: She is alert and oriented to person, place, and time. She displays normal reflexes. No cranial nerve deficit. She exhibits normal muscle tone. Coordination  normal.  Weakness in R leg compared to L    Skin: Skin is warm and dry. No rash noted.  Psychiatric: She has a normal mood and affect.    Assessment & Plan:   Diagnoses and all orders for this visit:  Cervical radicular pain -     Vitamin B12 -     Vitamin D, 25-hydroxy -     CBC -     TSH -     MR Cervical Spine Wo Contrast; Future -     Discontinue: traMADol (ULTRAM) 50 MG tablet; Take 1 tablet (50 mg total) by mouth every 12 (twelve) hours as needed. -     Discontinue: diazepam (VALIUM) 5 MG tablet; Take 1 tablet (5 mg total) by mouth every 12 (twelve) hours as needed for anxiety. -     predniSONE (DELTASONE) 20 MG tablet; Take every morning with food 60 mg daily for 3 days, 40 mg daily for 3 days, 30 mg daily for 3 days, 20 mg daily for 3 days, 10 mg daily for 3 days then STOP -     Discontinue: diazepam (VALIUM) 5 MG tablet; Take 1 tablet (5 mg total) by mouth every 12 (twelve) hours as needed for anxiety. -     diazepam (VALIUM) 5 MG tablet; Take 1 tablet (5 mg total) by mouth every 12 (twelve) hours as needed for anxiety. -     traMADol (ULTRAM) 50 MG tablet; Take 1 tablet (50 mg total) by mouth every 12 (twelve) hours as needed.  Radicular low back pain -     Vitamin B12 -     Vitamin D, 25-hydroxy -     CBC -     TSH -     MR Lumbar Spine Wo Contrast; Future -     Discontinue: traMADol (ULTRAM) 50 MG tablet; Take 1 tablet (50 mg total) by mouth every 12 (twelve) hours as needed. -     Discontinue: diazepam (VALIUM) 5 MG tablet; Take 1 tablet (5 mg total) by mouth every 12 (twelve) hours as needed for anxiety. -     predniSONE (DELTASONE) 20 MG tablet; Take every morning with food 60 mg daily for 3 days, 40 mg daily for 3 days, 30 mg daily for 3 days, 20 mg daily for 3 days, 10 mg daily for 3 days then STOP -     Discontinue: diazepam (VALIUM) 5 MG tablet; Take 1 tablet (5 mg total) by mouth every 12 (twelve) hours as needed for anxiety. -     diazepam (VALIUM) 5 MG tablet;  Take 1 tablet (5 mg total) by mouth every 12 (twelve) hours as needed for anxiety. -     traMADol (ULTRAM) 50 MG tablet; Take 1 tablet (50 mg total) by mouth every 12 (twelve) hours as needed.   There are no diagnoses linked to this encounter.  Meds ordered this encounter  Medications  . DISCONTD: traMADol (ULTRAM) 50 MG tablet  Sig: Take 1 tablet (50 mg total) by mouth every 12 (twelve) hours as needed.    Dispense:  60 tablet    Refill:  2  . DISCONTD: diazepam (VALIUM) 5 MG tablet    Sig: Take 1 tablet (5 mg total) by mouth every 12 (twelve) hours as needed for anxiety.    Dispense:  60 tablet    Refill:  2  . predniSONE (DELTASONE) 20 MG tablet    Sig: Take every morning with food 60 mg daily for 3 days, 40 mg daily for 3 days, 30 mg daily for 3 days, 20 mg daily for 3 days, 10 mg daily for 3 days then STOP    Dispense:  24 tablet    Refill:  0  . DISCONTD: diazepam (VALIUM) 5 MG tablet    Sig: Take 1 tablet (5 mg total) by mouth every 12 (twelve) hours as needed for anxiety.    Dispense:  60 tablet    Refill:  2  . diazepam (VALIUM) 5 MG tablet    Sig: Take 1 tablet (5 mg total) by mouth every 12 (twelve) hours as needed for anxiety.    Dispense:  60 tablet    Refill:  2  . traMADol (ULTRAM) 50 MG tablet    Sig: Take 1 tablet (50 mg total) by mouth every 12 (twelve) hours as needed.    Dispense:  60 tablet    Refill:  2    Follow-up: Return in about 4 weeks (around 11/09/2015).   Dessa Phi MD

## 2015-10-13 LAB — VITAMIN D 25 HYDROXY (VIT D DEFICIENCY, FRACTURES): Vit D, 25-Hydroxy: 29 ng/mL — ABNORMAL LOW (ref 30–100)

## 2015-10-14 DIAGNOSIS — E559 Vitamin D deficiency, unspecified: Secondary | ICD-10-CM | POA: Insufficient documentation

## 2015-10-14 MED ORDER — VITAMIN D3 50 MCG (2000 UT) PO TABS: 2000.0000 [IU] | ORAL_TABLET | Freq: Every day | ORAL | Status: AC

## 2015-10-14 NOTE — Addendum Note (Signed)
Addended by: Dessa PhiFUNCHES, Hadyn Azer on: 10/14/2015 11:07 AM   Modules accepted: Orders

## 2015-10-15 ENCOUNTER — Ambulatory Visit (HOSPITAL_COMMUNITY): Admission: RE | Admit: 2015-10-15 | Payer: Self-pay | Source: Ambulatory Visit

## 2015-10-15 ENCOUNTER — Ambulatory Visit (HOSPITAL_COMMUNITY)
Admission: RE | Admit: 2015-10-15 | Discharge: 2015-10-15 | Disposition: A | Discharge status: Home / Self Care | Payer: Self-pay | Source: Ambulatory Visit | Attending: Family Medicine | Admitting: Family Medicine

## 2015-10-15 ENCOUNTER — Telehealth: Payer: Self-pay | Admitting: Family Medicine

## 2015-10-15 DIAGNOSIS — M5412 Radiculopathy, cervical region: Secondary | ICD-10-CM

## 2015-10-15 DIAGNOSIS — M5410 Radiculopathy, site unspecified: Secondary | ICD-10-CM

## 2015-10-15 NOTE — Telephone Encounter (Signed)
Pt came in stating that she went to get the MRI but she was unable to do so Pt is too claustrophobic to get MRI done  Pt said that the radiology department said she needs to be put to sleep  Please call pt with updates if this can be approved Thank you

## 2015-10-19 NOTE — Telephone Encounter (Signed)
Patient may have MRI with sedation I recommend ativan 1-2 mg orally to start Does patent think she can tolerate MRI with oral sedation?

## 2015-10-21 NOTE — Telephone Encounter (Signed)
Left msg on machine for patient to call us back.

## 2015-10-21 NOTE — Telephone Encounter (Signed)
Spoke with patient and [facility] be there at 2:30 with meds in hand and a driver. We need to call her tomorrow, 10-22-15 and let her know that Ativan has been ordered and to pick it up. Need to print out script for Ativan 1-2 mg orally so she can [facility].

## 2015-10-22 ENCOUNTER — Other Ambulatory Visit: Payer: Self-pay | Admitting: Family Medicine

## 2015-10-22 MED ORDER — LORAZEPAM 0.5 MG PO TABS: 0.5000 mg | ORAL_TABLET | Freq: Once | ORAL | Status: DC

## 2015-10-22 MED FILL — LORazepam 0.5 MG TABS: 0.5 | 1 days supply | Qty: 1 | Fill #0

## 2015-10-22 NOTE — Telephone Encounter (Signed)
Telephone call to patient hipaa verification completed.  RN advised patient prescription is ready for pick-up.  Patient verbalized understanding. Pollyann KennedyKim Becton, RN, BSN

## 2015-10-23 ENCOUNTER — Other Ambulatory Visit: Payer: Self-pay | Admitting: Family Medicine

## 2015-10-23 ENCOUNTER — Ambulatory Visit (HOSPITAL_COMMUNITY): Admission: RE | Admit: 2015-10-23 | Payer: Self-pay | Source: Ambulatory Visit

## 2015-10-23 ENCOUNTER — Ambulatory Visit (HOSPITAL_COMMUNITY)
Admission: RE | Admit: 2015-10-23 | Discharge: 2015-10-23 | Disposition: A | Discharge status: Home / Self Care | Payer: Self-pay | Source: Ambulatory Visit | Attending: Family Medicine | Admitting: Family Medicine

## 2015-10-23 DIAGNOSIS — M5412 Radiculopathy, cervical region: Secondary | ICD-10-CM

## 2015-10-23 DIAGNOSIS — M8568 Other cyst of bone, other site: Secondary | ICD-10-CM | POA: Insufficient documentation

## 2015-10-23 DIAGNOSIS — M5126 Other intervertebral disc displacement, lumbar region: Secondary | ICD-10-CM | POA: Insufficient documentation

## 2015-10-23 DIAGNOSIS — M5410 Radiculopathy, site unspecified: Secondary | ICD-10-CM

## 2015-10-23 DIAGNOSIS — M4806 Spinal stenosis, lumbar region: Secondary | ICD-10-CM | POA: Insufficient documentation

## 2015-10-23 DIAGNOSIS — G544 Lumbosacral root disorders, not elsewhere classified: Secondary | ICD-10-CM | POA: Insufficient documentation

## 2015-10-23 DIAGNOSIS — G548 Other nerve root and plexus disorders: Secondary | ICD-10-CM | POA: Insufficient documentation

## 2015-10-28 ENCOUNTER — Other Ambulatory Visit: Payer: Self-pay | Admitting: Family Medicine

## 2015-10-28 DIAGNOSIS — M5410 Radiculopathy, site unspecified: Secondary | ICD-10-CM

## 2015-11-01 ENCOUNTER — Telehealth: Payer: Self-pay | Admitting: Family Medicine

## 2015-11-01 NOTE — Telephone Encounter (Signed)
Pt. Called stating she received a call from the nurse.  Please f/u with pt.

## 2015-11-02 NOTE — Telephone Encounter (Signed)
Contacted pt to inform her that there was no documentation of who contacted her. Was unable to lvm

## 2015-11-08 MED FILL — GABAPENTIN 300 MG CAPSULE: 300 | 30 days supply | Qty: 240 | Fill #3

## 2015-11-18 MED FILL — traMADol HCL 50 MG TABS: 50 | 30 days supply | Qty: 60 | Fill #1

## 2015-11-24 ENCOUNTER — Ambulatory Visit: Payer: Self-pay | Attending: Family Medicine

## 2015-11-24 MED FILL — LISINOPRIL 40 MG TABLET: 40 | 30 days supply | Qty: 30 | Fill #1

## 2015-11-24 MED FILL — ATORVASTATIN 40 MG TABLET: 40 | 30 days supply | Qty: 30 | Fill #2

## 2015-11-25 MED FILL — DULoxetine HCL 30 MG CPEP: 30 | 30 days supply | Qty: 60 | Fill #0

## 2015-12-23 ENCOUNTER — Other Ambulatory Visit: Payer: Self-pay | Admitting: Family Medicine

## 2015-12-23 DIAGNOSIS — M5410 Radiculopathy, site unspecified: Secondary | ICD-10-CM

## 2015-12-23 MED FILL — GABAPENTIN 300 MG CAPSULE: 300 | 30 days supply | Qty: 240 | Fill #0

## 2015-12-23 MED FILL — traMADol HCL 50 MG TABS: 50 | 30 days supply | Qty: 60 | Fill #2

## 2016-01-04 ENCOUNTER — Encounter: Payer: Self-pay | Admitting: Family Medicine

## 2016-01-04 ENCOUNTER — Ambulatory Visit: Payer: Self-pay | Attending: Family Medicine | Admitting: Family Medicine

## 2016-01-04 DIAGNOSIS — M5416 Radiculopathy, lumbar region: Secondary | ICD-10-CM | POA: Insufficient documentation

## 2016-01-04 DIAGNOSIS — Z23 Encounter for immunization: Secondary | ICD-10-CM

## 2016-01-04 DIAGNOSIS — M5412 Radiculopathy, cervical region: Secondary | ICD-10-CM | POA: Insufficient documentation

## 2016-01-04 DIAGNOSIS — F418 Other specified anxiety disorders: Secondary | ICD-10-CM

## 2016-01-04 DIAGNOSIS — Z87891 Personal history of nicotine dependence: Secondary | ICD-10-CM | POA: Insufficient documentation

## 2016-01-04 DIAGNOSIS — I10 Essential (primary) hypertension: Secondary | ICD-10-CM | POA: Insufficient documentation

## 2016-01-04 DIAGNOSIS — M5410 Radiculopathy, site unspecified: Secondary | ICD-10-CM

## 2016-01-04 DIAGNOSIS — F329 Major depressive disorder, single episode, unspecified: Secondary | ICD-10-CM | POA: Insufficient documentation

## 2016-01-04 DIAGNOSIS — M542 Cervicalgia: Secondary | ICD-10-CM | POA: Insufficient documentation

## 2016-01-04 DIAGNOSIS — F419 Anxiety disorder, unspecified: Secondary | ICD-10-CM | POA: Insufficient documentation

## 2016-01-04 DIAGNOSIS — M545 Low back pain: Secondary | ICD-10-CM | POA: Insufficient documentation

## 2016-01-04 DIAGNOSIS — G8929 Other chronic pain: Secondary | ICD-10-CM | POA: Insufficient documentation

## 2016-01-04 MED ORDER — MELOXICAM 7.5 MG PO TABS: 7.5000 mg | ORAL_TABLET | Freq: Every day | ORAL | 5 refills | Status: DC | PRN

## 2016-01-04 MED ORDER — PREDNISONE 20 MG PO TABS: ORAL_TABLET | ORAL | 0 refills | Status: DC

## 2016-01-04 MED ORDER — DIAZEPAM 5 MG PO TABS: 5.0000 mg | ORAL_TABLET | Freq: Two times a day (BID) | ORAL | 2 refills | Status: DC | PRN

## 2016-01-04 MED ORDER — DULOXETINE HCL 60 MG PO CPEP: 60.0000 mg | ORAL_CAPSULE | Freq: Every day | ORAL | 5 refills | Status: DC

## 2016-01-04 MED FILL — DULoxetine HCL 60 MG CPEP: 60 | 30 days supply | Qty: 30 | Fill #0

## 2016-01-04 MED FILL — ?MELOXICAM 7.5 MG TABLET: 7.5 | 30 days supply | Qty: 30 | Fill #0

## 2016-01-04 NOTE — Assessment & Plan Note (Signed)
Chronic Continue current regimen  Referral to ortho and pain management

## 2016-01-04 NOTE — Assessment & Plan Note (Signed)
A: well controlled Med: compliant P: Continue lisinopril 40 mg daily

## 2016-01-04 NOTE — Assessment & Plan Note (Signed)
Chronic Continue current regimen Another course of steroid as back pain is a bit worse  Referral to ortho and pain management

## 2016-01-04 NOTE — Patient Instructions (Addendum)
Michelle Stanley was seen today for back pain.  Diagnoses and all orders for this visit:  Radicular low back pain -     diazepam (VALIUM) 5 MG tablet; Take 1 tablet (5 mg total) by mouth every 12 (twelve) hours as needed for anxiety. -     predniSONE (DELTASONE) 20 MG tablet; Take every morning with food 60 mg daily for 3 days, 40 mg daily for 3 days, 30 mg daily for 3 days, 20 mg daily for 3 days, 10 mg daily for 3 days then STOP -     meloxicam (MOBIC) 7.5 MG tablet; Take 1 tablet (7.5 mg total) by mouth daily as needed for pain. -     Ambulatory referral to Pain Clinic  Cervical radicular pain -     diazepam (VALIUM) 5 MG tablet; Take 1 tablet (5 mg total) by mouth every 12 (twelve) hours as needed for anxiety. -     predniSONE (DELTASONE) 20 MG tablet; Take every morning with food 60 mg daily for 3 days, 40 mg daily for 3 days, 30 mg daily for 3 days, 20 mg daily for 3 days, 10 mg daily for 3 days then STOP -     meloxicam (MOBIC) 7.5 MG tablet; Take 1 tablet (7.5 mg total) by mouth daily as needed for pain. -     Ambulatory referral to Pain Clinic  Anxiety and depression -     DULoxetine (CYMBALTA) 60 MG capsule; Take 1 capsule (60 mg total) by mouth daily.   F/u in 4-6 weeks for pap smear  Dr. Armen PickupFunches

## 2016-01-04 NOTE — Progress Notes (Signed)
Subjective:  Patient ID: Michelle Stanley, female    DOB: [birth year]  Age: 55 y.o. MRN: [id number]  CC: Back Pain   HPI Michelle Stanley presents for    1. Chronic back: chronic pain with radicular symptoms that are worsening. Pain is worse on R side. Also with numbness down R leg. No falls. She walks with a cane. Tramadol does help reduce pain. Valium does help with sleep. Gabapentin does help with numbness. She has a lumbar MRI in 2014 that revealed spinal stenosis at L4-L5. Pain is a bit better compared to last visit.   2. Neck pain: chronic. Also worse on R side. Numbness improve with prednisone course last visit. She is now getting  Stiffness and flexion in R 4th finger.   3. HTN: taking lisinopril. No HA, CP, SOB or leg swelling. No cough.   Social History  Substance Use Topics  . Smoking status: Former Smoker    Packs/day: 1.00    Years: 30.00    Types: Cigarettes    Quit date: 07/17/2014  . Smokeless tobacco: Never Used  . Alcohol use No    Outpatient Medications Prior to Visit  Medication Sig Dispense Refill  . aspirin 325 MG tablet Take 325 mg by mouth daily.    Marland Kitchen. atorvastatin (LIPITOR) 40 MG tablet Take 1 tablet (40 mg total) by mouth daily. 90 tablet 3  . diazepam (VALIUM) 5 MG tablet Take 1 tablet (5 mg total) by mouth every 12 (twelve) hours as needed for anxiety. 60 tablet 2  . diclofenac (VOLTAREN) 75 MG EC tablet TAKE 1 TABLET BY MOUTH TWICE DAILY 60 tablet 2  . DULoxetine (CYMBALTA) 60 MG capsule Take 1 capsule (60 mg total) by mouth daily. 30 capsule 5  . Fluticasone-Salmeterol (ADVAIR DISKUS) 500-50 MCG/DOSE AEPB Inhale 1 puff into the lungs 2 (two) times daily. 60 each 0  . gabapentin (NEURONTIN) 300 MG capsule TAKE 2 CAPSULES BY MOUTH 4 TIMES DAILY. 240 capsule 3  . levalbuterol (XOPENEX HFA) 45 MCG/ACT inhaler Inhale 2 puffs into the lungs every 6 (six) hours as needed for wheezing. 1 Inhaler 12  . lisinopril (PRINIVIL,ZESTRIL) 40 MG tablet Take 1 tablet (40  mg total) by mouth daily. 30 tablet 5  . LORazepam (ATIVAN) 0.5 MG tablet Take 1 tablet (0.5 mg total) by mouth once. 1 tablet 0  . predniSONE (DELTASONE) 20 MG tablet Take every morning with food 60 mg daily for 3 days, 40 mg daily for 3 days, 30 mg daily for 3 days, 20 mg daily for 3 days, 10 mg daily for 3 days then STOP 24 tablet 0  . Tiotropium Bromide Monohydrate (SPIRIVA RESPIMAT) 2.5 MCG/ACT AERS Inhale 2 puffs into the lungs daily. Rinse and gargle after each use. 4 g 0  . traMADol (ULTRAM) 50 MG tablet Take 1 tablet (50 mg total) by mouth every 12 (twelve) hours as needed. 60 tablet 2  . Cholecalciferol (VITAMIN D3) 2000 units TABS Take 2,000 Units by mouth daily. (Patient not taking: Reported on 01/04/2016) 30 tablet 11   No facility-administered medications prior to visit.     ROS Review of Systems  Constitutional: Negative for chills and fever.  Eyes: Negative for visual disturbance.  Respiratory: Negative for shortness of breath.   Cardiovascular: Negative for chest pain.  Gastrointestinal: Negative for abdominal pain and blood in stool.  Musculoskeletal: Positive for arthralgias, back pain, gait problem and neck pain.  Skin: Positive for color change. Negative for rash.  Allergic/Immunologic: Negative for immunocompromised state.  Neurological: Positive for dizziness, light-headedness and numbness (in arms and legs ).  Hematological: Negative for adenopathy. Does not bruise/bleed easily.  Psychiatric/Behavioral: Positive for sleep disturbance. Negative for dysphoric mood and suicidal ideas.    Objective:  BP 124/78 (BP Location: Right Arm, Patient Position: Sitting, Cuff Size: Large)   Pulse 77   Temp 97.9 F (36.6 C) (Oral)   Wt 227 lb 3.2 oz (103.1 kg)   SpO2 96%   BMI 39.00 kg/m   BP/Weight 01/04/2016 10/23/2015 10/23/2015  Systolic BP 124 - -  Diastolic BP 78 - -  Wt. (Lbs) 227.2 220 220  BMI 39 37.74 37.74   Physical Exam  Constitutional: She is oriented to  person, place, and time. She appears well-developed and well-nourished. No distress.  HENT:  Head: Normocephalic and atraumatic.  Neck: Decreased range of motion present.  Cardiovascular: Normal rate, regular rhythm, normal heart sounds and intact distal pulses.   Pulmonary/Chest: Effort normal and breath sounds normal.  Musculoskeletal: She exhibits no edema.  Lymphadenopathy:    She has no cervical adenopathy.  Neurological: She is alert and oriented to person, place, and time. She displays normal reflexes. No cranial nerve deficit. She exhibits normal muscle tone. Coordination normal.  Weakness in R leg compared to L    Skin: Skin is warm and dry. No rash noted.  Psychiatric: She has a normal mood and affect.    Assessment & Plan:  Michelle Stanley was seen today for back pain.  Diagnoses and all orders for this visit:  Radicular low back pain -     Discontinue: diazepam (VALIUM) 5 MG tablet; Take 1 tablet (5 mg total) by mouth every 12 (twelve) hours as needed for anxiety. -     Discontinue: predniSONE (DELTASONE) 20 MG tablet; Take every morning with food 60 mg daily for 3 days, 40 mg daily for 3 days, 30 mg daily for 3 days, 20 mg daily for 3 days, 10 mg daily for 3 days then STOP -     meloxicam (MOBIC) 7.5 MG tablet; Take 1 tablet (7.5 mg total) by mouth daily as needed for pain. -     Ambulatory referral to Pain Clinic -     predniSONE (DELTASONE) 20 MG tablet; Take every morning with food 60 mg daily for 3 days, 40 mg daily for 3 days, 30 mg daily for 3 days, 20 mg daily for 3 days, 10 mg daily for 3 days then STOP -     diazepam (VALIUM) 5 MG tablet; Take 1 tablet (5 mg total) by mouth every 12 (twelve) hours as needed for anxiety.  Cervical radicular pain -     Discontinue: diazepam (VALIUM) 5 MG tablet; Take 1 tablet (5 mg total) by mouth every 12 (twelve) hours as needed for anxiety. -     Discontinue: predniSONE (DELTASONE) 20 MG tablet; Take every morning with food 60 mg daily  for 3 days, 40 mg daily for 3 days, 30 mg daily for 3 days, 20 mg daily for 3 days, 10 mg daily for 3 days then STOP -     meloxicam (MOBIC) 7.5 MG tablet; Take 1 tablet (7.5 mg total) by mouth daily as needed for pain. -     Ambulatory referral to Pain Clinic -     predniSONE (DELTASONE) 20 MG tablet; Take every morning with food 60 mg daily for 3 days, 40 mg daily for 3 days, 30 mg daily for 3 days,  20 mg daily for 3 days, 10 mg daily for 3 days then STOP -     diazepam (VALIUM) 5 MG tablet; Take 1 tablet (5 mg total) by mouth every 12 (twelve) hours as needed for anxiety.  Anxiety and depression -     DULoxetine (CYMBALTA) 60 MG capsule; Take 1 capsule (60 mg total) by mouth daily.  Encounter for immunization -     Flu Vaccine QUAD 36+ mos IM  Essential hypertension   There are no diagnoses linked to this encounter. There are no diagnoses linked to this encounter.  No orders of the defined types were placed in this encounter.   Follow-up: Return in about 4 weeks (around 02/01/2016) for pap .   Dessa Phi MD

## 2016-01-10 MED FILL — diazePAM 5 MG TABS: 5 | 30 days supply | Qty: 60 | Fill #0

## 2016-01-28 ENCOUNTER — Other Ambulatory Visit: Payer: Self-pay | Admitting: Family Medicine

## 2016-01-28 DIAGNOSIS — M5412 Radiculopathy, cervical region: Secondary | ICD-10-CM

## 2016-01-28 DIAGNOSIS — M5410 Radiculopathy, site unspecified: Secondary | ICD-10-CM

## 2016-01-28 MED FILL — GABAPENTIN 300 MG CAPSULE: 300 | 30 days supply | Qty: 240 | Fill #1

## 2016-02-01 NOTE — Telephone Encounter (Signed)
Please inform patient that tramadol Rx is ready for pick up

## 2016-02-04 MED FILL — traMADol HCL 50 MG TABS: 50 | 30 days supply | Qty: 60 | Fill #0

## 2016-03-06 MED FILL — GABAPENTIN 300 MG CAPSULE: 300 | 30 days supply | Qty: 240 | Fill #2

## 2016-03-06 MED FILL — traMADol HCL 50 MG TABS: 50 | 30 days supply | Qty: 60 | Fill #1

## 2016-03-06 MED FILL — ?MELOXICAM 7.5 MG TABLET: 7.5 | 30 days supply | Qty: 30 | Fill #1

## 2016-04-28 MED FILL — traMADol HCL 50 MG TABS: 50 | 30 days supply | Qty: 60 | Fill #2

## 2016-04-28 MED FILL — ?MELOXICAM 7.5 MG TABLET: 7.5 | 30 days supply | Qty: 30 | Fill #2

## 2016-04-28 MED FILL — GABAPENTIN 300 MG CAPSULE: 300 | 30 days supply | Qty: 240 | Fill #3

## 2016-06-02 ENCOUNTER — Other Ambulatory Visit: Payer: Self-pay | Admitting: Family Medicine

## 2016-06-02 DIAGNOSIS — M5410 Radiculopathy, site unspecified: Secondary | ICD-10-CM

## 2016-06-02 DIAGNOSIS — M5412 Radiculopathy, cervical region: Secondary | ICD-10-CM

## 2016-06-02 MED FILL — ATORVASTATIN 40 MG TABLET: 40 | 30 days supply | Qty: 30 | Fill #3

## 2016-06-02 MED FILL — DULoxetine HCL 30 MG CPEP: 30 | 30 days supply | Qty: 60 | Fill #1

## 2016-06-02 MED FILL — ?LISINOPRIL 40 MG TABLET: 40 MG | 30 days supply | Qty: 30 | Fill #2

## 2016-06-02 MED FILL — MELOXICAM 7.5 MG TABLET: 7.5 | 30 days supply | Qty: 30 | Fill #3

## 2016-06-06 ENCOUNTER — Encounter: Payer: Self-pay | Admitting: Family Medicine

## 2016-06-06 ENCOUNTER — Ambulatory Visit: Payer: Self-pay | Attending: Family Medicine | Admitting: Family Medicine

## 2016-06-06 VITALS — BP 128/76 | HR 95 | Temp 98.6°F | Resp 18 | Ht 64.0 in | Wt 228.0 lb

## 2016-06-06 DIAGNOSIS — M5410 Radiculopathy, site unspecified: Secondary | ICD-10-CM

## 2016-06-06 DIAGNOSIS — J410 Simple chronic bronchitis: Secondary | ICD-10-CM

## 2016-06-06 DIAGNOSIS — Z7951 Long term (current) use of inhaled steroids: Secondary | ICD-10-CM | POA: Insufficient documentation

## 2016-06-06 DIAGNOSIS — F419 Anxiety disorder, unspecified: Secondary | ICD-10-CM | POA: Insufficient documentation

## 2016-06-06 DIAGNOSIS — Z7982 Long term (current) use of aspirin: Secondary | ICD-10-CM | POA: Insufficient documentation

## 2016-06-06 DIAGNOSIS — F329 Major depressive disorder, single episode, unspecified: Secondary | ICD-10-CM | POA: Insufficient documentation

## 2016-06-06 DIAGNOSIS — I10 Essential (primary) hypertension: Secondary | ICD-10-CM

## 2016-06-06 DIAGNOSIS — M5412 Radiculopathy, cervical region: Secondary | ICD-10-CM

## 2016-06-06 DIAGNOSIS — Z1231 Encounter for screening mammogram for malignant neoplasm of breast: Secondary | ICD-10-CM

## 2016-06-06 DIAGNOSIS — G8929 Other chronic pain: Secondary | ICD-10-CM | POA: Insufficient documentation

## 2016-06-06 DIAGNOSIS — Z87891 Personal history of nicotine dependence: Secondary | ICD-10-CM | POA: Insufficient documentation

## 2016-06-06 DIAGNOSIS — M5416 Radiculopathy, lumbar region: Secondary | ICD-10-CM | POA: Insufficient documentation

## 2016-06-06 MED ORDER — GABAPENTIN 300 MG PO CAPS: ORAL_CAPSULE | ORAL | 3 refills | Status: DC

## 2016-06-06 MED ORDER — MELOXICAM 7.5 MG PO TABS: 7.5000 mg | ORAL_TABLET | Freq: Every day | ORAL | 5 refills | Status: DC | PRN

## 2016-06-06 MED ORDER — DIAZEPAM 5 MG PO TABS: 5.0000 mg | ORAL_TABLET | Freq: Every evening | ORAL | 5 refills | Status: DC | PRN

## 2016-06-06 MED ORDER — FLUTICASONE-SALMETEROL 500-50 MCG/DOSE IN AEPB: 1.0000 | INHALATION_SPRAY | Freq: Two times a day (BID) | RESPIRATORY_TRACT | 5 refills | Status: DC

## 2016-06-06 MED ORDER — LISINOPRIL 40 MG PO TABS: 40.0000 mg | ORAL_TABLET | Freq: Every day | ORAL | 5 refills | Status: DC

## 2016-06-06 MED ORDER — LEVALBUTEROL TARTRATE 45 MCG/ACT IN AERO: 2.0000 | INHALATION_SPRAY | Freq: Four times a day (QID) | RESPIRATORY_TRACT | 12 refills | Status: DC | PRN

## 2016-06-06 MED ORDER — TRAMADOL HCL 50 MG PO TABS: 50.0000 mg | ORAL_TABLET | Freq: Three times a day (TID) | ORAL | 3 refills | Status: DC | PRN

## 2016-06-06 MED ORDER — LEVALBUTEROL TARTRATE 45 MCG/ACT IN AERO: 2.0000 | INHALATION_SPRAY | Freq: Four times a day (QID) | RESPIRATORY_TRACT | 12 refills | Status: AC | PRN

## 2016-06-06 MED FILL — GABAPENTIN 300 MG CAPSULE: 300 | 30 days supply | Qty: 240 | Fill #0

## 2016-06-06 MED FILL — traMADol HCL 50 MG TABS: 50 | 30 days supply | Qty: 90 | Fill #0

## 2016-06-06 MED FILL — !ADVAIR 500/50 DISKUS: 500-50 | 14 days supply | Qty: 28 | Fill #0

## 2016-06-06 MED FILL — XOPENEX HFA 45 MCG INHALER: 45 | 25 days supply | Qty: 15 | Fill #1

## 2016-06-06 MED FILL — diazePAM 5 MG TABS: 5 | 30 days supply | Qty: 60 | Fill #1

## 2016-06-06 NOTE — Progress Notes (Signed)
Patient is here for establish care  Patient is here for lower back & leg pain   patient requested refill on gabapentin and her inhaler &  tramadol

## 2016-06-06 NOTE — Assessment & Plan Note (Signed)
Controlled. Continue current regimen. 

## 2016-06-06 NOTE — Assessment & Plan Note (Signed)
Chronic pain Uncontrolled Continue regimen with increase in tramadol from BID to TID Consider change from cymbalta to TCA at next OV

## 2016-06-06 NOTE — Assessment & Plan Note (Signed)
Chronic pain Uncontrolled Continue regimen with increase in tramadol from BID to TID Consider change from cymbalta to TCA at next OV  

## 2016-06-06 NOTE — Progress Notes (Signed)
Subjective:  Patient ID: Michelle Stanley, female    DOB: 18-Apr-1961  Age: 56 y.o. MRN: [id number]  CC: Establish Care   HPI Michelle Stanley presents for    1. Chronic back: started in 2009 or 2010. Chronic pain with radicular symptoms that are worsening. Pain is worse on R side. Also with numbness down R leg. No falls. She walks with a cane. Tramadol does help reduce pain. Valium does help with sleep. Gabapentin does help with numbness. She has a lumbar MRI in 2014 that revealed spinal stenosis at L4-L5. Pain 8-10/10. Pain is exacerbated by prolonged walking or prolonged sitting. She walks with a cane. Have not fallen, but feels balance is off.   Lumbar MRI 10/23/2015: IMPRESSION: Potentially symptomatic RIGHT-sided neural impingement at L4-5 related to a central and rightward disc extrusion, cephalad migrated fragment along with RIGHT foraminal and extraforaminal component. Facet arthropathy is superimposed. Moderate central canal stenosis along with RIGHT greater than LEFT L5 nerve root impingement and RIGHT L4 nerve root impingement.  Central and rightward protrusion L3-4, also extends into the foramen where RIGHT L3 and RIGHT L4 nerve root impingement are observed.  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  2. Neck pain: started in 2006. Chronic. Worse on R sided. Numbness in R hand. Having flexion in fingers. Also noticing bluish discoloration in fingers. The same problem of lack of finger flexion is occurring is the left side.   3. HTN: taking lisinopril. No HA, CP, SOB or leg swelling. No cough.   4. Depression: feels depressed that she has pain and cannot work. She is taking Cymbalta daily. She sleep poorly. She has anxiety that is helped with nightly valium.   Social History  Substance Use Topics  . Smoking status: Former Smoker    Packs/day: 1.00    Years: 30.00    Types: Cigarettes    Quit date:  07/17/2014  . Smokeless tobacco: Never Used  . Alcohol use No    Outpatient Medications Prior to Visit  Medication Sig Dispense Refill  . aspirin 325 MG tablet Take 325 mg by mouth daily.    Marland Kitchen atorvastatin (LIPITOR) 40 MG tablet Take 1 tablet (40 mg total) by mouth daily. 90 tablet 3  . diazepam (VALIUM) 5 MG tablet Take 1 tablet (5 mg total) by mouth every 12 (twelve) hours as needed for anxiety. 60 tablet 2  . diclofenac (VOLTAREN) 75 MG EC tablet TAKE 1 TABLET BY MOUTH TWICE DAILY 60 tablet 2  . DULoxetine (CYMBALTA) 60 MG capsule Take 1 capsule (60 mg total) by mouth daily. 30 capsule 5  . Fluticasone-Salmeterol (ADVAIR DISKUS) 500-50 MCG/DOSE AEPB Inhale 1 puff into the lungs 2 (two) times daily. 60 each 0  . gabapentin (NEURONTIN) 300 MG capsule TAKE 2 CAPSULES BY MOUTH 4 TIMES DAILY. 240 capsule 3  . levalbuterol (XOPENEX HFA) 45 MCG/ACT inhaler Inhale 2 puffs into the lungs every 6 (six) hours as needed for wheezing. 1 Inhaler 12  . lisinopril (PRINIVIL,ZESTRIL) 40 MG tablet Take 1 tablet (40 mg total) by mouth daily. 30 tablet 5  . meloxicam (MOBIC) 7.5 MG tablet Take 1 tablet (7.5 mg total) by mouth daily as needed for pain. 30 tablet 5  . predniSONE (DELTASONE) 20 MG tablet Take every morning with food 60 mg daily for 3 days, 40 mg daily for 3 days, 30 mg daily for 3 days, 20 mg daily for  3 days, 10 mg daily for 3 days then STOP 24 tablet 0  . Tiotropium Bromide Monohydrate (SPIRIVA RESPIMAT) 2.5 MCG/ACT AERS Inhale 2 puffs into the lungs daily. Rinse and gargle after each use. 4 g 0  . traMADol (ULTRAM) 50 MG tablet TAKE 1 TABLET BY MOUTH EVERY 12 HOURS AS NEEDED. 60 tablet 2  . Cholecalciferol (VITAMIN D3) 2000 units TABS Take 2,000 Units by mouth daily. (Patient not taking: Reported on 01/04/2016) 30 tablet 11   No facility-administered medications prior to visit.     ROS Review of Systems  Constitutional: Negative for chills and fever.  Eyes: Positive for visual disturbance  (sees dots in eye sometimes ).  Respiratory: Negative for shortness of breath.   Cardiovascular: Negative for chest pain.  Gastrointestinal: Positive for diarrhea (loose stools ). Negative for abdominal pain and blood in stool.  Musculoskeletal: Positive for arthralgias, back pain, gait problem and neck pain.  Skin: Positive for color change. Negative for rash.  Allergic/Immunologic: Negative for immunocompromised state.  Neurological: Positive for numbness (in arms and legs ). Negative for dizziness and light-headedness.  Hematological: Negative for adenopathy. Does not bruise/bleed easily.  Psychiatric/Behavioral: Positive for dysphoric mood and sleep disturbance. Negative for suicidal ideas. The patient is nervous/anxious.     Objective:  BP 128/76 (BP Location: Right Arm, Patient Position: Sitting, Cuff Size: Large)   Pulse 95   Temp 98.6 F (37 C) (Oral)   Resp 18   Ht 5\' 4"  (1.626 m)   Wt 228 lb (103.4 kg)   SpO2 95%   BMI 39.14 kg/m   BP/Weight 06/06/2016 01/04/2016 10/23/2015  Systolic BP 128 124 -  Diastolic BP 76 78 -  Wt. (Lbs) 228 227.2 220  BMI 39.14 39 37.74   Physical Exam  Constitutional: She is oriented to person, place, and time. She appears well-developed and well-nourished. No distress.  HENT:  Head: Normocephalic and atraumatic.  Neck: Decreased range of motion present.  Cardiovascular: Normal rate, regular rhythm, normal heart sounds and intact distal pulses.   Pulmonary/Chest: Effort normal and breath sounds normal.  Musculoskeletal: She exhibits no edema.  Lymphadenopathy:    She has no cervical adenopathy.  Neurological: She is alert and oriented to person, place, and time. She displays normal reflexes. No cranial nerve deficit. She exhibits normal muscle tone. Coordination normal.  Weakness in R leg compared to L    Skin: Skin is warm and dry. No rash noted.  Psychiatric: She has a normal mood and affect.   Lab Results  Component Value Date    HGBA1C 5.70 06/11/2015   [facility] 06/06/2016 01/04/2016 01/04/2016  Decreased Interest 3 1 0  Down, Depressed, Hopeless 2 2 0  PHQ - 2 Score 5 3 0  Altered sleeping 2 2 -  Tired, decreased energy 2 1 -  Change in appetite 2 1 -  Feeling bad or failure about yourself  1 2 -  Trouble concentrating 1 1 -  Moving slowly or fidgety/restless 1 1 -  Suicidal thoughts 0 1 -  PHQ-9 Score 14 12 -  Some recent data might be hidden   GAD 7 : Generalized Anxiety Score 06/06/2016 01/04/2016 10/12/2015 07/05/2015  Nervous, Anxious, on Edge 3 2 0 2  Control/stop worrying 3 2 0 3  Worry too much - different things 3 2 0 3  Trouble relaxing 2 2 0 2  Restless 2 2 0 2  Easily annoyed or irritable 2 2 0 2  Afraid - awful might happen 1 1 0 3  Total GAD 7 Score 16 13 0 17     Assessment & Plan:  There are no diagnoses linked to this encounter. Michelle Stanley was seen today for establish care.  Diagnoses and all orders for this visit:  Radicular low back pain -     Ambulatory referral to Pain Clinic -     diazepam (VALIUM) 5 MG tablet; Take 1 tablet (5 mg total) by mouth at bedtime as needed for anxiety. -     traMADol (ULTRAM) 50 MG tablet; Take 1 tablet (50 mg total) by mouth every 8 (eight) hours as needed. -     Discontinue: meloxicam (MOBIC) 7.5 MG tablet; Take 1 tablet (7.5 mg total) by mouth daily as needed for pain. -     meloxicam (MOBIC) 7.5 MG tablet; Take 1 tablet (7.5 mg total) by mouth daily as needed for pain. -     gabapentin (NEURONTIN) 300 MG capsule; TAKE 2 CAPSULES BY MOUTH 4 TIMES DAILY.  Cervical radicular pain -     Ambulatory referral to Pain Clinic -     diazepam (VALIUM) 5 MG tablet; Take 1 tablet (5 mg total) by mouth at bedtime as needed for anxiety. -     traMADol (ULTRAM) 50 MG tablet; Take 1 tablet (50 mg total) by mouth every 8 (eight) hours as needed. -     Discontinue: meloxicam (MOBIC) 7.5 MG tablet; Take 1 tablet (7.5 mg total) by mouth daily as needed for  pain. -     meloxicam (MOBIC) 7.5 MG tablet; Take 1 tablet (7.5 mg total) by mouth daily as needed for pain.  Simple chronic bronchitis (HCC) -     Discontinue: levalbuterol (XOPENEX HFA) 45 MCG/ACT inhaler; Inhale 2 puffs into the lungs every 6 (six) hours as needed for wheezing. -     Discontinue: Fluticasone-Salmeterol (ADVAIR DISKUS) 500-50 MCG/DOSE AEPB; Inhale 1 puff into the lungs 2 (two) times daily. -     Fluticasone-Salmeterol (ADVAIR DISKUS) 500-50 MCG/DOSE AEPB; Inhale 1 puff into the lungs 2 (two) times daily. -     levalbuterol (XOPENEX HFA) 45 MCG/ACT inhaler; Inhale 2 puffs into the lungs every 6 (six) hours as needed for wheezing.  Essential hypertension -     Discontinue: lisinopril (PRINIVIL,ZESTRIL) 40 MG tablet; Take 1 tablet (40 mg total) by mouth daily. -     lisinopril (PRINIVIL,ZESTRIL) 40 MG tablet; Take 1 tablet (40 mg total) by mouth daily.  Visit for screening mammogram -     MM DIGITAL SCREENING BILATERAL; Future   There are no diagnoses linked to this encounter.  No orders of the defined types were placed in this encounter.   Follow-up: Return in about 2 weeks (around 06/20/2016) for pap smear .   Dessa Phi MD

## 2016-06-06 NOTE — Patient Instructions (Addendum)
Michelle Stanley was seen today for establish care.  Diagnoses and all orders for this visit:  Radicular low back pain -     Ambulatory referral to Pain Clinic -     diazepam (VALIUM) 5 MG tablet; Take 1 tablet (5 mg total) by mouth at bedtime as needed for anxiety. -     traMADol (ULTRAM) 50 MG tablet; Take 1 tablet (50 mg total) by mouth every 8 (eight) hours as needed. -     meloxicam (MOBIC) 7.5 MG tablet; Take 1 tablet (7.5 mg total) by mouth daily as needed for pain.  Cervical radicular pain -     Ambulatory referral to Pain Clinic -     diazepam (VALIUM) 5 MG tablet; Take 1 tablet (5 mg total) by mouth at bedtime as needed for anxiety. -     traMADol (ULTRAM) 50 MG tablet; Take 1 tablet (50 mg total) by mouth every 8 (eight) hours as needed. -     meloxicam (MOBIC) 7.5 MG tablet; Take 1 tablet (7.5 mg total) by mouth daily as needed for pain.  Simple chronic bronchitis (HCC) -     levalbuterol (XOPENEX HFA) 45 MCG/ACT inhaler; Inhale 2 puffs into the lungs every 6 (six) hours as needed for wheezing. -     Fluticasone-Salmeterol (ADVAIR DISKUS) 500-50 MCG/DOSE AEPB; Inhale 1 puff into the lungs 2 (two) times daily.  Essential hypertension -     lisinopril (PRINIVIL,ZESTRIL) 40 MG tablet; Take 1 tablet (40 mg total) by mouth daily.   F/u in 2-3 weeks for pap smear  Dr. Armen PickupFunches

## 2016-06-14 ENCOUNTER — Encounter: Payer: Self-pay | Admitting: Physical Medicine & Rehabilitation

## 2016-06-27 ENCOUNTER — Encounter: Payer: Self-pay | Admitting: Family Medicine

## 2016-06-27 ENCOUNTER — Ambulatory Visit: Payer: Self-pay | Attending: Family Medicine | Admitting: Family Medicine

## 2016-06-27 VITALS — BP 131/78 | HR 84 | Temp 97.6°F | Ht 64.0 in | Wt 232.4 lb

## 2016-06-27 DIAGNOSIS — M5416 Radiculopathy, lumbar region: Secondary | ICD-10-CM | POA: Insufficient documentation

## 2016-06-27 DIAGNOSIS — M5412 Radiculopathy, cervical region: Secondary | ICD-10-CM

## 2016-06-27 DIAGNOSIS — M5410 Radiculopathy, site unspecified: Secondary | ICD-10-CM

## 2016-06-27 DIAGNOSIS — M19041 Primary osteoarthritis, right hand: Secondary | ICD-10-CM

## 2016-06-27 DIAGNOSIS — M19042 Primary osteoarthritis, left hand: Secondary | ICD-10-CM

## 2016-06-27 DIAGNOSIS — Z124 Encounter for screening for malignant neoplasm of cervix: Secondary | ICD-10-CM

## 2016-06-27 DIAGNOSIS — Z7982 Long term (current) use of aspirin: Secondary | ICD-10-CM | POA: Insufficient documentation

## 2016-06-27 DIAGNOSIS — Z87891 Personal history of nicotine dependence: Secondary | ICD-10-CM | POA: Insufficient documentation

## 2016-06-27 MED ORDER — MELOXICAM 15 MG PO TABS: 7.5000 mg | ORAL_TABLET | Freq: Every day | ORAL | 2 refills | Status: DC | PRN

## 2016-06-27 NOTE — Progress Notes (Signed)
Subjective:  Patient ID: Michelle Stanley, female    DOB: [birth year]  Age: 56 y.o. MRN: [id number]  CC: Gynecologic Exam   HPI Michelle Stanley presents for   1. Screening pap smear: overdue for screening pap smear. Not sexually active. No children. Denies vaginal bleeding and vaginal discharge.   2. Hand pain: worsening. Pain in both hands. Fingers drawing up. Having swelling at PIP joints. Also with pain and nodules in palm. This has been ongoing for past 2 years. She was evaluated for autoimmune arthritis. ANA was positive with negative titers. Negative rheumatoid factor and CCP. She takes mobic 7.5 mg daily.   Social History  Substance Use Topics  . Smoking status: Former Smoker    Packs/day: 1.00    Years: 30.00    Types: Cigarettes    Quit date: 07/17/2014  . Smokeless tobacco: Never Used  . Alcohol use No    Outpatient Medications Prior to Visit  Medication Sig Dispense Refill  . aspirin 325 MG tablet Take 325 mg by mouth daily.    Marland Kitchen atorvastatin (LIPITOR) 40 MG tablet Take 1 tablet (40 mg total) by mouth daily. 90 tablet 3  . Cholecalciferol (VITAMIN D3) 2000 units TABS Take 2,000 Units by mouth daily. (Patient not taking: Reported on 01/04/2016) 30 tablet 11  . diazepam (VALIUM) 5 MG tablet Take 1 tablet (5 mg total) by mouth at bedtime as needed for anxiety. 30 tablet 5  . DULoxetine (CYMBALTA) 60 MG capsule Take 1 capsule (60 mg total) by mouth daily. 30 capsule 5  . Fluticasone-Salmeterol (ADVAIR DISKUS) 500-50 MCG/DOSE AEPB Inhale 1 puff into the lungs 2 (two) times daily. 60 each 5  . gabapentin (NEURONTIN) 300 MG capsule TAKE 2 CAPSULES BY MOUTH 4 TIMES DAILY. 240 capsule 3  . levalbuterol (XOPENEX HFA) 45 MCG/ACT inhaler Inhale 2 puffs into the lungs every 6 (six) hours as needed for wheezing. 1 Inhaler 12  . lisinopril (PRINIVIL,ZESTRIL) 40 MG tablet Take 1 tablet (40 mg total) by mouth daily. 30 tablet 5  . meloxicam (MOBIC) 7.5 MG tablet Take 1 tablet (7.5 mg  total) by mouth daily as needed for pain. 30 tablet 5  . Tiotropium Bromide Monohydrate (SPIRIVA RESPIMAT) 2.5 MCG/ACT AERS Inhale 2 puffs into the lungs daily. Rinse and gargle after each use. 4 g 0  . traMADol (ULTRAM) 50 MG tablet TAKE 1 TABLET BY MOUTH EVERY 12 HOURS AS NEEDED. 60 tablet 2  . traMADol (ULTRAM) 50 MG tablet Take 1 tablet (50 mg total) by mouth every 8 (eight) hours as needed. 90 tablet 3   No facility-administered medications prior to visit.     ROS Review of Systems  Constitutional: Negative for chills and fever.  Eyes: Positive for visual disturbance (sees dots in eye sometimes ).  Respiratory: Negative for shortness of breath.   Cardiovascular: Negative for chest pain.  Gastrointestinal: Positive for diarrhea (loose stools ). Negative for abdominal pain and blood in stool.  Musculoskeletal: Positive for arthralgias, back pain, gait problem and neck pain.  Skin: Positive for color change. Negative for rash.  Allergic/Immunologic: Negative for immunocompromised state.  Neurological: Positive for numbness (in arms and legs ). Negative for dizziness and light-headedness.  Hematological: Negative for adenopathy. Does not bruise/bleed easily.  Psychiatric/Behavioral: Positive for dysphoric mood and sleep disturbance. Negative for suicidal ideas. The patient is nervous/anxious.     Objective:  BP 131/78 (BP Location: Left Arm, Patient Position: Sitting, Cuff Size: Large)   Pulse 84  Temp 97.6 F (36.4 C) (Oral)   Ht 5\' 4"  (1.626 m)   Wt 232 lb 6.4 oz (105.4 kg)   SpO2 94%   BMI 39.89 kg/m   BP/Weight 06/27/2016 06/06/2016 01/04/2016  Systolic BP 131 128 124  Diastolic BP 78 76 78  Wt. (Lbs) 232.4 228 227.2  BMI 39.89 39.14 39    Physical Exam  Constitutional: She appears well-developed and well-nourished. No distress.  Cardiovascular: Normal rate, regular rhythm, normal heart sounds and intact distal pulses.   Pulmonary/Chest: Effort normal and breath sounds  normal.  Genitourinary: Vagina normal and uterus normal. Pelvic exam was performed with patient prone. There is no rash, tenderness or lesion on the right labia. There is no rash, tenderness or lesion on the left labia. Cervix exhibits no motion tenderness, no discharge and no friability.  Musculoskeletal: She exhibits no edema.       Hands: Lymphadenopathy:       Right: No inguinal adenopathy present.       Left: No inguinal adenopathy present.  Skin: Skin is warm and dry. No rash noted.    Assessment & Plan:  Michelle Stanley was seen today for gynecologic exam.  Diagnoses and all orders for this visit:  Pap smear for cervical cancer screening -     Cytology - PAP  Radicular low back pain -     meloxicam (MOBIC) 15 MG tablet; Take 0.5-1 tablets (7.5-15 mg total) by mouth daily as needed for pain.  Cervical radicular pain -     meloxicam (MOBIC) 15 MG tablet; Take 0.5-1 tablets (7.5-15 mg total) by mouth daily as needed for pain.  Primary osteoarthritis of both hands -     Cytology - PAP   There are no diagnoses linked to this encounter.  No orders of the defined types were placed in this encounter.   Follow-up: Return in about 6 weeks (around 08/08/2016) for chronic pain .   Dessa PhiJosalyn Jalysa Swopes MD

## 2016-06-27 NOTE — Assessment & Plan Note (Signed)
Yes you are taking mobic which is the antiinflammatory Increase dose from 7.5 to 15 mg for next week or so Then go back to 7.5 mg  You may increase to 15 mg as needed for flare up of osteoarthritis

## 2016-06-27 NOTE — Patient Instructions (Addendum)
Michelle Stanley was seen today for gynecologic exam.  Diagnoses and all orders for this visit:  Pap smear for cervical cancer screening -     Cytology - PAP  Radicular low back pain -     meloxicam (MOBIC) 15 MG tablet; Take 0.5-1 tablets (7.5-15 mg total) by mouth daily as needed for pain.  Cervical radicular pain -     meloxicam (MOBIC) 15 MG tablet; Take 0.5-1 tablets (7.5-15 mg total) by mouth daily as needed for pain.  Primary osteoarthritis of both hands -     Cytology - PAP   Yes you are taking mobic which is the antiinflammatory Increase dose from 7.5 to 15 mg for next week or so Then go back to 7.5 mg  You may increase to 15 mg as needed for flare up of osteoarthritis  Keep appointment with pain management You are also do for screening colonoscopy, see below. I do recommend it. Let me know if you would like a referral to GI for colonoscopy.    F/u with me in 6 weeks for chronic pain   Dr. Armen Pickup   Osteoarthritis Osteoarthritis is a type of arthritis that affects tissue that covers the ends of bones in joints (cartilage). Cartilage acts as a cushion between the bones and helps them move smoothly. Osteoarthritis results when cartilage in the joints gets worn down. Osteoarthritis is sometimes called "wear and tear" arthritis. Osteoarthritis is the most common form of arthritis. It often occurs in older people. It is a condition that gets worse over time (a progressive condition). Joints that are most often affected by this condition are in:  Fingers.  Toes.  Hips.  Knees.  Spine, including neck and lower back. What are the causes? This condition is caused by age-related wearing down of cartilage that covers the ends of bones. What increases the risk? The following factors may make you more likely to develop this condition:  Older age.  Being overweight or obese.  Overuse of joints, such as in athletes.  Past injury of a joint.  Past surgery on a joint.  Family  history of osteoarthritis. What are the signs or symptoms? The main symptoms of this condition are pain, swelling, and stiffness in the joint. The joint may lose its shape over time. Small pieces of bone or cartilage may break off and float inside of the joint, which may cause more pain and damage to the joint. Small deposits of bone (osteophytes) may grow on the edges of the joint. Other symptoms may include:  A grating or scraping feeling inside the joint when you move it.  Popping or creaking sounds when you move. Symptoms may affect one or more joints. Osteoarthritis in a major joint, such as your knee or hip, can make it painful to walk or exercise. If you have osteoarthritis in your hands, you might not be able to grip items, twist your hand, or control small movements of your hands and fingers (fine motor skills). How is this diagnosed? This condition may be diagnosed based on:  Your medical history.  A physical exam.  Your symptoms.  X-rays of the affected joint(s).  Blood tests to rule out other types of arthritis. How is this treated? There is no cure for this condition, but treatment can help to control pain and improve joint function. Treatment plans may include:  A prescribed exercise program that allows for rest and joint relief. You may work with a physical therapist.  A weight control plan.  Pain relief techniques, such as:  Applying heat and cold to the joint.  Electric pulses delivered to nerve endings under the skin (transcutaneous electrical nerve stimulation, or TENS).  Massage.  Certain nutritional supplements.  NSAIDs or prescription medicines to help relieve pain.  Medicine to help relieve pain and inflammation (corticosteroids). This can be given by mouth (orally) or as an injection.  Assistive devices, such as a brace, wrap, splint, specialized glove, or cane.  Surgery, such as:  An osteotomy. This is done to reposition the bones and relieve pain  or to remove loose pieces of bone and cartilage.  Joint replacement surgery. You may need this surgery if you have very bad (advanced) osteoarthritis. Follow these instructions at home: Activity   Rest your affected joints as directed by your health care provider.  Do not drive or use heavy machinery while taking prescription pain medicine.  Exercise as directed. Your health care provider or physical therapist may recommend specific types of exercise, such as:  Strengthening exercises. These are done to strengthen the muscles that support joints that are affected by arthritis. They can be performed with weights or with exercise bands to add resistance.  Aerobic activities. These are exercises, such as brisk walking or water aerobics, that get your heart pumping.  Range-of-motion activities. These keep your joints easy to move.  Balance and agility exercises. Managing pain, stiffness, and swelling   If directed, apply heat to the affected area as often as told by your health care provider. Use the heat source that your health care provider recommends, such as a moist heat pack or a heating pad.  If you have a removable assistive device, remove it as told by your health care provider.  Place a towel between your skin and the heat source. If your health care provider tells you to keep the assistive device on while you apply heat, place a towel between the assistive device and the heat source.  Leave the heat on for 20-30 minutes.  Remove the heat if your skin turns bright red. This is especially important if you are unable to feel pain, heat, or cold. You may have a greater risk of getting burned.  If directed, put ice on the affected joint:  If you have a removable assistive device, remove it as told by your health care provider.  Put ice in a plastic bag.  Place a towel between your skin and the bag. If your health care provider tells you to keep the assistive device on during  icing, place a towel between the assistive device and the bag.  Leave the ice on for 20 minutes, 2-3 times a day. General instructions   Take over-the-counter and prescription medicines only as told by your health care provider.  Maintain a healthy weight. Follow instructions from your health care provider for weight control. These may include dietary restrictions.  Do not use any products that contain nicotine or tobacco, such as cigarettes and e-cigarettes. These can delay bone healing. If you need help quitting, ask your health care provider.  Use assistive devices as directed by your health care provider.  Keep all follow-up visits as told by your health care provider. This is important. Where to find more information:  [employer] of Arthritis and Musculoskeletal and Skin Diseases: www.niams.http://www.myers.net/  [employer] on Aging: https://walker.com/  American College of Rheumatology: www.rheumatology.org Contact a health care provider if:  Your skin turns red.  You develop a rash.  You have pain that  gets worse.  You have a fever along with joint or muscle aches. Get help right away if:  You lose a lot of weight.  You suddenly lose your appetite.  You have night sweats. Summary  Osteoarthritis is a type of arthritis that affects tissue covering the ends of bones in joints (cartilage).  This condition is caused by age-related wearing down of cartilage that covers the ends of bones.  The main symptom of this condition is pain, swelling, and stiffness in the joint.  There is no cure for this condition, but treatment can help to control pain and improve joint function. This information is not intended to replace advice given to you by your health care provider. Make sure you discuss any questions you have with your health care provider. Document Released: 04/10/2005 Document Revised: 12/13/2015 Document Reviewed: 12/13/2015 Elsevier Interactive Patient Education   2017 [employer]

## 2016-06-28 LAB — CYTOLOGY - PAP
Chlamydia: NEGATIVE
DIAGNOSIS: NEGATIVE
HPV: NOT DETECTED
Neisseria Gonorrhea: NEGATIVE
TRICH (WINDOWPATH): NEGATIVE

## 2016-06-28 MED FILL — ?MELOXICAM 15 MG TABLET: 15 MG | 30 days supply | Qty: 30 | Fill #0

## 2016-06-29 ENCOUNTER — Telehealth: Payer: Self-pay

## 2016-06-29 NOTE — Telephone Encounter (Signed)
Pt was called and informed of lab results. 

## 2016-06-30 ENCOUNTER — Other Ambulatory Visit: Payer: Self-pay | Admitting: Family Medicine

## 2016-06-30 DIAGNOSIS — Z8673 Personal history of transient ischemic attack (TIA), and cerebral infarction without residual deficits: Secondary | ICD-10-CM

## 2016-06-30 MED FILL — GABAPENTIN 300 MG CAPSULE: 300 | 30 days supply | Qty: 240 | Fill #1

## 2016-06-30 MED FILL — ?LISINOPRIL 40 MG TABLET: 40 MG | 30 days supply | Qty: 30 | Fill #3

## 2016-06-30 MED FILL — traMADol HCL 50 MG TABS: 50 | 30 days supply | Qty: 90 | Fill #1

## 2016-06-30 MED FILL — DULoxetine HCL 30 MG CPEP: 30 | 30 days supply | Qty: 60 | Fill #2

## 2016-06-30 MED FILL — ATORVASTATIN 40 MG TABLET: 40 | 30 days supply | Qty: 30 | Fill #0

## 2016-06-30 MED FILL — MELOXICAM 7.5 MG TABLET: 7.5 | 30 days supply | Qty: 30 | Fill #4

## 2016-07-04 ENCOUNTER — Ambulatory Visit: Payer: Self-pay | Attending: Family Medicine

## 2016-07-13 ENCOUNTER — Other Ambulatory Visit: Payer: Self-pay | Admitting: Family Medicine

## 2016-07-13 DIAGNOSIS — M5412 Radiculopathy, cervical region: Secondary | ICD-10-CM

## 2016-07-13 DIAGNOSIS — M5410 Radiculopathy, site unspecified: Secondary | ICD-10-CM

## 2016-07-14 ENCOUNTER — Ambulatory Visit (HOSPITAL_BASED_OUTPATIENT_CLINIC_OR_DEPARTMENT_OTHER): Payer: Self-pay | Admitting: Physical Medicine & Rehabilitation

## 2016-07-14 ENCOUNTER — Encounter: Payer: Self-pay | Admitting: Physical Medicine & Rehabilitation

## 2016-07-14 ENCOUNTER — Encounter: Payer: Self-pay | Attending: Physical Medicine & Rehabilitation

## 2016-07-14 VITALS — BP 136/82 | HR 80 | Resp 14

## 2016-07-14 DIAGNOSIS — Z9889 Other specified postprocedural states: Secondary | ICD-10-CM | POA: Insufficient documentation

## 2016-07-14 DIAGNOSIS — M4716 Other spondylosis with myelopathy, lumbar region: Secondary | ICD-10-CM | POA: Insufficient documentation

## 2016-07-14 DIAGNOSIS — Z8249 Family history of ischemic heart disease and other diseases of the circulatory system: Secondary | ICD-10-CM | POA: Insufficient documentation

## 2016-07-14 DIAGNOSIS — K589 Irritable bowel syndrome without diarrhea: Secondary | ICD-10-CM | POA: Insufficient documentation

## 2016-07-14 DIAGNOSIS — Z87891 Personal history of nicotine dependence: Secondary | ICD-10-CM | POA: Insufficient documentation

## 2016-07-14 DIAGNOSIS — M48061 Spinal stenosis, lumbar region without neurogenic claudication: Principal | ICD-10-CM

## 2016-07-14 DIAGNOSIS — M4807 Spinal stenosis, lumbosacral region: Secondary | ICD-10-CM | POA: Insufficient documentation

## 2016-07-14 DIAGNOSIS — Z8673 Personal history of transient ischemic attack (TIA), and cerebral infarction without residual deficits: Secondary | ICD-10-CM | POA: Insufficient documentation

## 2016-07-14 DIAGNOSIS — Z800 Family history of malignant neoplasm of digestive organs: Secondary | ICD-10-CM | POA: Insufficient documentation

## 2016-07-14 DIAGNOSIS — G992 Myelopathy in diseases classified elsewhere: Secondary | ICD-10-CM

## 2016-07-14 DIAGNOSIS — I10 Essential (primary) hypertension: Secondary | ICD-10-CM | POA: Insufficient documentation

## 2016-07-14 DIAGNOSIS — G8929 Other chronic pain: Secondary | ICD-10-CM | POA: Insufficient documentation

## 2016-07-14 NOTE — Progress Notes (Addendum)
Subjective:    Patient ID: Michelle Stanley, female    DOB: 21-Jan-1961, 56 y.o.   MRN: [id number]  HPI Chief complaint is low back pain radiating into the right greater than the left lower extremity  56 year old female who has a history of low back pain and right lower extremity pain into the posterior thigh, onset in 2013. She has been seen in the emergency department in 2013. After 2 visits. An MRI was performed which demonstrated some mild stenosis at right L4-5. No significant central stenosis. At that time there was some facet arthropathy at L4-5. Patient was treated conservatively by Dr. Burnard LeighSarah Neal. Patient had 1 or 2 urge incontinent episodes reported to her at that time, but it predated her back pain. Patient was treated with a Medrol Dosepak as well as  gabapentin, as well as ibuprofen. Patient initially had some improvement then started developing some tingling in the left medial thigh, Dr. Lloyd HugerNeil ordered MRI of the lumbar spine in 2014, but the patient was then lost to follow-up till she saw Dr[facility]upFunches [facility] She was diagnosed with IBS with diarrhea at that time as well as COPD, also was complaining of some right arm numbness. During another office visit in 2017, complained of numbness in arms and legs and losing her balance. She was placed on Valium for anxiety In March 2017. Started on Cymbalta 10/23/2015 MRI lumbar spine demonstrated foraminal protrusion to the left side noncompressive, right L3, L4 protrusion into to the foramen, potential impingement on the right L3 and right L4 nerve roots. Advanced facet arthropathy L4-5 as well as a disc extrusion, central and rightward causing both right L4 and right L5 nerve root impingement, left discal cyst with left L5, left S1 nerve root impingement. In September 2017. Started complaining of numbness in the right leg. Some improvement with tramadol. Also complaining of pain in the arm. Started on a Medrol  Dosepak.  Patient states that over the last 3-4 months she has had some stool incontinence. Worsening of back pain and right lower extremity pain. Increasing numbness in the right lower extremity but no weakness. Pain Inventory Average Pain 4 Pain Right Now 8 My pain is constant, sharp, burning, dull, stabbing, tingling and aching  In the last 24 hours, has pain interfered with the following? General activity 7 Relation with others 8 Enjoyment of life 9 What TIME of day is your pain at its worst? all Sleep (in general) Poor  Pain is worse with: walking, bending, sitting and standing Pain improves with: medication Relief from Meds: 2  Mobility walk with assistance use a cane how many minutes can you walk? 5  Function I need assistance with the following:  household duties and shopping  Neuro/Psych bowel control problems weakness numbness tingling trouble walking spasms  Prior Studies new visit MRI LUMBAR SPINE WITHOUT CONTRAST  TECHNIQUE: Multiplanar, multisequence MR imaging of the lumbar spine was performed. No intravenous contrast was administered.  COMPARISON:  10/17/2011.  FINDINGS: Segmentation:  Standard.  Alignment:  Physiologic except for trace anterolisthesis L4-5.  Vertebrae: Endplate reactive changes above and below L5-S1 reflect degenerative disc disease.  Conus medullaris: Extends to the L1-L2 level and appears normal.  Paraspinal and other soft tissues: Unremarkable  Disc levels:  L1-L2:  Normal  L2-L3:  Foraminal protrusion on the LEFT.  This is noncompressive.  L3-L4: Central and rightward protrusion extends to the neural foramen and beyond. Asymmetric RIGHT greater than LEFT facet arthropathy  and ligamentum flavum hypertrophy. RIGHT subarticular zone narrowing and RIGHT foraminal narrowing could affect the L3 and L4 nerve roots.  L4-L5: Trace anterolisthesis is facet mediated. There is advanced facet arthropathy with  large joint effusions. Ligamentum flavum hypertrophy is superimposed. There is moderate central canal stenosis compounded by a central and rightward disc extrusion with cephalad migration. The disc also extends into the foramen and beyond. RIGHT greater than LEFT L5 nerve root impingement in the canal. RIGHT L4 nerve root impingement in the foramen and extraforaminal soft tissues.  L5-S1: Vacuum phenomenon. There is a large discal cyst in the LEFT subarticular zone, displacing the thecal sac and LEFT S1 nerve root. The lesion measures 9 x 6 x 14 mm and contains an air-fluid level indicating communication with the disc space. LEFT facet arthropathy is superimposed. There is no RIGHT subarticular zone narrowing but BILATERAL foraminal narrowing is present, worse on the LEFT, affecting the L5 nerve roots.  Compared with 2013, the discal cyst was not present. The disc extrusions at L3-4 and L4-5 are new/worse.  IMPRESSION: Potentially symptomatic RIGHT-sided neural impingement at L4-5 related to a central and rightward disc extrusion, cephalad migrated fragment along with RIGHT foraminal and extraforaminal component. Facet arthropathy is superimposed. Moderate central canal stenosis along with RIGHT greater than LEFT L5 nerve root impingement and RIGHT L4 nerve root impingement.  Central and rightward protrusion L3-4, also extends into the foramen where RIGHT L3 and RIGHT L4 nerve root impingement are observed.  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   Electronically Signed   By: Elsie Stain M.D.   On: 10/23/2015 21:09  MRI CERVICAL SPINE WITHOUT CONTRAST  TECHNIQUE: Multiplanar, multisequence MR imaging of the cervical spine was performed. No intravenous contrast was administered.  COMPARISON:  None.  FINDINGS: The patient was unable to remain motionless for the exam. Small or subtle  lesions could be overlooked.  Alignment: Anatomic  Vertebrae: No fracture, evidence of discitis, or bone lesion.  Cord: Normal signal and morphology.  Posterior Fossa, vertebral arteries, paraspinal tissues: Negative.  Disc levels:  C2-3:  Normal.  C3-4: BILATERAL facet arthropathy, worse on the LEFT. No impingement.  C4-5: BILATERAL facet arthropathy, worse on the RIGHT. RIGHT-sided uncinate spurring. No impingement.  C5-6: Shallow central and leftward protrusion. Slight effacement anterior subarachnoid space. No impingement.  C6-7:  Negative.  C7-T1:  BILATERAL facet arthropathy.  No impingement.  IMPRESSION: Mild multilevel spondylosis as described. Potentially symptomatic RIGHT-sided pathology at L4-5, where facet arthropathy is observed. No RIGHT-sided compressive lesion is established.   Electronically Signed   By: Elsie Stain M.D.   On: 10/23/2015 20:46 Physicians involved in your care new visit   Family History  Problem Relation Age of Onset  . Cancer Mother     deceased of throat & stomach cancer  . Hypertension Mother   . Heart disease Father   . Hypertension Father    Social History   Social History  . Marital status: Single    Spouse name: N/A  . Number of children: N/A  . Years of education: N/A   Social History Main Topics  . Smoking status: Former Smoker    Packs/day: 1.00    Years: 30.00    Types: Cigarettes    Quit date: 07/17/2014  . Smokeless tobacco: Never Used  . Alcohol use No  . Drug use: No  . Sexual activity: Not Asked   Other Topics  Concern  . None   Social History Narrative  . None   Past Surgical History:  Procedure Laterality Date  . Right knee surgery     Past Medical History:  Diagnosis Date  . Hypertension Dx 2009  . Irritable bowel syndrome Aug 2009  . Sciatic nerve pain   . Sciatic pain   . Stroke [facility]) Aug 2009   BP 136/82   Pulse 80   Resp 14   SpO2 93%   Opioid Risk Score:     Fall Risk Score:  `1  Depression screen PHQ 2/9  Depression screen [facility] 2/9 06/27/2016 06/06/2016 01/04/2016 01/04/2016 10/12/2015 07/05/2015 06/24/2015  Decreased Interest 2 3 1  0 0 1 2  Down, Depressed, Hopeless 2 2 2  0 0 2 2  PHQ - 2 Score 4 5 3  0 0 3 4  Altered sleeping 2 2 2  - 0 2 1  Tired, decreased energy 2 2 1  - 0 2 2  Change in appetite 2 2 1  - 0 2 2  Feeling bad or failure about yourself  2 1 2  - 0 2 2  Trouble concentrating 2 1 1  - 0 2 2  Moving slowly or fidgety/restless 3 1 1  - 0 2 3  Suicidal thoughts 0 0 1 - 0 0 0  PHQ-9 Score 17 14 12  - 0 15 16  Some recent data might be hidden    Review of Systems  HENT: Negative.   Eyes: Negative.   Respiratory: Negative.   Cardiovascular: Negative.   Gastrointestinal: Negative.        Bowel control problems   Endocrine: Negative.   Musculoskeletal: Positive for arthralgias, back pain, gait problem and myalgias.       Spasms  Allergic/Immunologic: Negative.   Neurological: Positive for weakness and numbness.       Tingling  All other systems reviewed and are negative.      Objective:   Physical Exam  Constitutional: She is oriented to person, place, and time. She appears well-developed and well-nourished.  HENT:  Head: Normocephalic and atraumatic.  Eyes: Conjunctivae and EOM are normal. Pupils are equal, round, and reactive to light.  Neck: Normal range of motion.  Cardiovascular: Normal rate and regular rhythm.   Pulmonary/Chest: Effort normal. She has no wheezes. She has no rales.  Diminished breath sounds  Abdominal: Soft. Bowel sounds are normal. She exhibits no distension. There is no tenderness.  Neurological: She is alert and oriented to person, place, and time.  Reflex Scores:      Tricep reflexes are 2+ on the right side and 2+ on the left side.      Bicep reflexes are 2+ on the right side and 2+ on the left side.      Brachioradialis reflexes are 2+ on the right side and 2+ on the left side.      Patellar  reflexes are 2+ on the right side and 2+ on the left side.      Achilles reflexes are 0 on the right side and 1+ on the left side. 4 plus bilateral deltoid, biceps, triceps, grip, hip flexor and knee extension and ankle dorsiflexion. 3. At the right EHL, 4 at the left EHL  Psychiatric: Her speech is normal. Her mood appears not anxious. Cognition and memory are normal.  Tends to lean forward. Pain behaviors when getting up off exam table  Nursing note and vitals reviewed.  Decreased right C5, C 8, dermatome distribution. Pinprick Decreased right L  5 S1 dermatomal distribution. Pinprick, Absent left S1 dermatomal distribution. Pinprick Also, there is decreased pinprick sensation in S2, S3, S4 dermatomes on the right side      Assessment & Plan:  1. Lumbar spinal stenosis with disc extrusions, foraminal stenosis affecting right L3, L4, L5 nerve roots, discal cyst on the left, affecting L5-S1 nerve roots. She has had worsening with time. She has a history of stress incontinence and cannot tell me if this has worsened. She notes episodes of fecal incontinence, she does have history of irritable bowel but given decreased pinprick sensation in the right sacral dermatomes, would recommend surgical evaluation, possible repeat imaging. She has no large muscle group weakness, does have some right EHL weakness  If orthopedics does not feel patient requires surgical intervention. I would  trial spinal injections for back pain as well as radicular pain. Given her insurance status. She requires ortho referral via ED. Patient states she will go at her earliest convenience

## 2016-07-14 NOTE — Patient Instructions (Signed)
Please go to emergency dept so you can get referral to Orthopedics/spine surgeon (orange card) You have had bowel continence problems and you have reduced sensation in multiple lumbar and sacral distribution on the right side These symptoms have progressed since the MRI performed in July 2017 This needs to be evaluated for surgical decompression.

## 2016-07-18 ENCOUNTER — Emergency Department (HOSPITAL_COMMUNITY): Payer: Self-pay

## 2016-07-18 ENCOUNTER — Encounter (HOSPITAL_COMMUNITY): Payer: Self-pay

## 2016-07-18 ENCOUNTER — Emergency Department (HOSPITAL_COMMUNITY)
Admission: EM | Admit: 2016-07-18 | Discharge: 2016-07-18 | Disposition: A | Discharge status: Home / Self Care | Payer: Self-pay | Attending: Emergency Medicine | Admitting: Emergency Medicine

## 2016-07-18 DIAGNOSIS — M5441 Lumbago with sciatica, right side: Secondary | ICD-10-CM | POA: Insufficient documentation

## 2016-07-18 DIAGNOSIS — J449 Chronic obstructive pulmonary disease, unspecified: Secondary | ICD-10-CM | POA: Insufficient documentation

## 2016-07-18 DIAGNOSIS — Z87891 Personal history of nicotine dependence: Secondary | ICD-10-CM | POA: Insufficient documentation

## 2016-07-18 DIAGNOSIS — I10 Essential (primary) hypertension: Secondary | ICD-10-CM | POA: Insufficient documentation

## 2016-07-18 DIAGNOSIS — Z7982 Long term (current) use of aspirin: Secondary | ICD-10-CM | POA: Insufficient documentation

## 2016-07-18 DIAGNOSIS — R32 Unspecified urinary incontinence: Secondary | ICD-10-CM | POA: Insufficient documentation

## 2016-07-18 DIAGNOSIS — Z8673 Personal history of transient ischemic attack (TIA), and cerebral infarction without residual deficits: Secondary | ICD-10-CM | POA: Insufficient documentation

## 2016-07-18 DIAGNOSIS — Z79899 Other long term (current) drug therapy: Secondary | ICD-10-CM | POA: Insufficient documentation

## 2016-07-18 LAB — URINALYSIS, ROUTINE W REFLEX MICROSCOPIC
BILIRUBIN URINE: NEGATIVE
Glucose, UA: NEGATIVE mg/dL
Hgb urine dipstick: NEGATIVE
Ketones, ur: NEGATIVE mg/dL
Leukocytes, UA: NEGATIVE
NITRITE: NEGATIVE
PH: 5 (ref 5.0–8.0)
Protein, ur: NEGATIVE mg/dL
SPECIFIC GRAVITY, URINE: 1.013 (ref 1.005–1.030)

## 2016-07-18 MED ORDER — LORAZEPAM 1 MG PO TABS
1.0000 mg | ORAL_TABLET | Freq: Once | ORAL | Status: AC | PRN
  Administered 2016-07-18: 1 mg via ORAL
  Filled 2016-07-18: qty 1

## 2016-07-18 MED ORDER — TRAMADOL HCL 50 MG PO TABS: 50.0000 mg | ORAL_TABLET | Freq: Three times a day (TID) | ORAL | 0 refills | Status: DC | PRN

## 2016-07-18 MED ORDER — PREDNISONE 10 MG (21) PO TBPK: ORAL_TABLET | Freq: Every day | ORAL | 0 refills | Status: DC

## 2016-07-18 NOTE — ED Triage Notes (Signed)
Pt stated that she was seen by PCP on 3/23 for bowel and bladder incontinence.She was instructed by PCP to report to ED for an MRI and referral to ortho. Pt has a hx of back pain. Pt elected to come to ED today, "I thought it might be too busy on Friday". Pt is alert, oriented and ambulatory with assist of cane. No balance issues observed. Pt ambulated to bathroom with supervision.

## 2016-07-18 NOTE — ED Triage Notes (Signed)
Patient c/o right low back pain x 1 year. Patient states she was referred [facility].

## 2016-07-18 NOTE — Discharge Instructions (Signed)
Your MRI today did show some worsening disc protrusion in her back which is likely causing her increased pain and paresthesias of her feet. There was no evidence of spinal cord compression. Recommend to follow-up in the neurosurgery clinic. I have called them and they should be arranging appointment for you. If you do not hear from them in the next 24-48 hours, please call to confirm an appointment. May increase her tramadol to 2 tablets every 8 hours as needed for pain when severe, otherwise can continue with one tablet. Take the prednisone as directed. Return here for any new or worsening symptoms including recurrent falls, numbness or weakness of the legs, recurrent bowel/bladder incontinence, etc.

## 2016-07-18 NOTE — ED Notes (Signed)
Patient transported to MRI 

## 2016-07-18 NOTE — ED Provider Notes (Signed)
[facility]-EMERGENCY DEPT Provider Note   CSN: [id number] Arrival date & time: 07/18/16  0920  By signing my name below, I, Teofilo Pod, attest that this documentation has been prepared under the direction and in the presence of Sharilyn Sites, PA-C. Electronically Signed: Teofilo Pod, ED Scribe. 07/18/2016. 10:21 AM.    History   Chief Complaint Chief Complaint  Patient presents with  . Back Pain    The history is provided by the patient. No language interpreter was used.   HPI Comments:  Michelle Stanley is a 56 y.o. female who presents to the Emergency Department complaining of constant right lower back pain x 1 year. She states that the pain radiates from her back to her right buttocks and down the right leg. Pt was referred here from [facility] rehabilitation for a repeat MRI. She had an MRI on her back last summer, and states that she has been having increasing leg numbness since. She continues using a cane to assist with ambulation. She denies any frank weakness of the right leg.  She has no symptoms in the left leg currently. States she has not had any falls or recent trauma. States she has been having some issues with bowel bladder incontinence, notably she will get the urge to urinate or have a bowel movement but will not make it to the bathroom in time. States sometimes she dribbles a small amount of urine before she gets to the bathroom. States she's not had any incontinence issues at night. She denies any fever or chills. Patient was seen by her primary care doctor at the wellness clinic and sent here for repeat MRI and referral to neurosurgery for ongoing management.  Past Medical History:  Diagnosis Date  . Hypertension Dx 2009  . Irritable bowel syndrome Aug 2009  . Sciatic nerve pain   . Sciatic pain   . Stroke [facility]) Aug 2009    Patient Active Problem List   Diagnosis Date Noted  . Vitamin D insufficiency 10/14/2015  . History of stroke 06/11/2015  . Cervical  radicular pain 02/02/2015  . Raynaud's syndrome 02/02/2015  . Anxiety and depression 10/22/2014  . COPD (chronic obstructive pulmonary disease) (HCC) 09/10/2014  . Osteoarthritis of both hands 08/18/2014  . Irritable bowel syndrome with diarrhea 08/18/2014  . Smoker 08/18/2014  . Hypertension 08/04/2014  . Obesity 12/10/2012  . Radicular low back pain 10/31/2011    Past Surgical History:  Procedure Laterality Date  . Right knee surgery      OB History    No data available       Home Medications    Prior to Admission medications   Medication Sig Start Date End Date Taking? Authorizing Provider  aspirin 325 MG tablet Take 325 mg by mouth daily.    Historical Provider, MD  atorvastatin (LIPITOR) 40 MG tablet TAKE 1 TABLET BY MOUTH DAILY 06/30/16   Dessa Phi, MD  Cholecalciferol (VITAMIN D3) 2000 units TABS Take 2,000 Units by mouth daily. Patient not taking: Reported on 01/04/2016 10/14/15   Dessa Phi, MD  diazepam (VALIUM) 5 MG tablet Take 1 tablet (5 mg total) by mouth at bedtime as needed for anxiety. 06/06/16   Josalyn Funches, MD  DULoxetine (CYMBALTA) 60 MG capsule Take 1 capsule (60 mg total) by mouth daily. 01/04/16   Josalyn Funches, MD  Fluticasone-Salmeterol (ADVAIR DISKUS) 500-50 MCG/DOSE AEPB Inhale 1 puff into the lungs 2 (two) times daily. 06/06/16   Dessa Phi, MD  gabapentin (NEURONTIN)  300 MG capsule TAKE 2 CAPSULES BY MOUTH 4 TIMES DAILY. 06/06/16   Dessa Phi, MD  levalbuterol (XOPENEX HFA) 45 MCG/ACT inhaler Inhale 2 puffs into the lungs every 6 (six) hours as needed for wheezing. 06/06/16   Josalyn Funches, MD  lisinopril (PRINIVIL,ZESTRIL) 40 MG tablet Take 1 tablet (40 mg total) by mouth daily. 06/06/16   Josalyn Funches, MD  meloxicam (MOBIC) 15 MG tablet Take 0.5-1 tablets (7.5-15 mg total) by mouth daily as needed for pain. 06/27/16   Josalyn Funches, MD  Tiotropium Bromide Monohydrate (SPIRIVA RESPIMAT) 2.5 MCG/ACT AERS Inhale 2 puffs into the  lungs daily. Rinse and gargle after each use. 06/11/15   Dessa Phi, MD  traMADol (ULTRAM) 50 MG tablet Take 1 tablet (50 mg total) by mouth every 8 (eight) hours as needed. 06/06/16   Dessa Phi, MD    Family History Family History  Problem Relation Age of Onset  . Cancer Mother     deceased of throat & stomach cancer  . Hypertension Mother   . Heart disease Father   . Hypertension Father     Social History Social History  Substance Use Topics  . Smoking status: Former Smoker    Packs/day: 1.00    Years: 30.00    Types: Cigarettes    Quit date: 07/17/2014  . Smokeless tobacco: Never Used  . Alcohol use No     Allergies   Penicillins   Review of Systems Review of Systems  Genitourinary:       Positive for bowel incontinence.  Musculoskeletal: Positive for back pain.  Neurological: Positive for numbness.  All other systems reviewed and are negative.    Physical Exam Updated Vital Signs BP (!) 162/70 (BP Location: Right Arm)   Pulse 94   Temp 98.2 F (36.8 C) (Oral)   Resp 20   Ht 5\' 4"  (1.626 m)   Wt 230 lb (104.3 kg)   SpO2 99%   BMI 39.48 kg/m   Physical Exam  Constitutional: She is oriented to person, place, and time. She appears well-developed and well-nourished.  HENT:  Head: Normocephalic and atraumatic.  Mouth/Throat: Oropharynx is clear and moist.  Eyes: Conjunctivae and EOM are normal. Pupils are equal, round, and reactive to light.  Neck: Normal range of motion.  Cardiovascular: Normal rate, regular rhythm and normal heart sounds.   Pulmonary/Chest: Effort normal and breath sounds normal.  Abdominal: Soft. Bowel sounds are normal.  Musculoskeletal: Normal range of motion.  Diffuse tenderness of the lumbar spine without signs of acute trauma, pain reproduced with movement of the right leg without definitive straight leg raise, she has normal strength and sensation of both legs during testing, she remains ambulatory with cane on right  side which is baseline  Neurological: She is alert and oriented to person, place, and time.  Skin: Skin is warm and dry.  Psychiatric: She has a normal mood and affect.  Nursing note and vitals reviewed.    ED Treatments / Results  DIAGNOSTIC STUDIES:  Oxygen Saturation is 99% on RA, normal by my interpretation.    COORDINATION OF CARE:  10:21 AM Discussed treatment plan with pt at bedside and pt agreed to plan.   Labs (all labs ordered are listed, but only abnormal results are displayed) Labs Reviewed - No data to display  EKG  EKG Interpretation None       Radiology No results found.  Procedures Procedures (including critical care time)  Medications Ordered in ED Medications - No  data to display   Initial Impression / Assessment and Plan / ED Course  I have reviewed the triage vital signs and the nursing notes.  Pertinent labs & imaging results that were available during my care of the patient were reviewed by me and considered in my medical decision making (see chart for details).  56 year old female here with back pain. She was seen by her PCP last week and was concerned about worsening back pain and some new incontinence. After talking with patient, it seems she is having some urge incontinence as she occasionally is not making it to the bathroom in time. She has had a small amount of dribbling prior to getting to the bathroom but no true, uncontrolled incontinence. Here she has no focal neurologic deficits. She remains ambulatory with her cane which is baseline. UA without signs of infection. Repeat MRI was obtained today with noted disc protrusions at L3-S1 but no evidence of cord compression.  She has some stable disc degeneration as well.   Discussed results with neurosurgery-- they have reviewed her updated MRI from today, will see her in clinic as an outpatient.  Patient has remained without any neurologic deficits here. She's not had any episodes of  incontinence. Feel she is stable for discharge. We'll have her continue her tramadol, may increase to 2 tablets if needed for severe pain, will also add prednisone taper.  Discussed plan with patient, she acknowledged understanding and agreed with plan of care.  Return precautions given for new or worsening symptoms.  Final Clinical Impressions(s) / ED Diagnoses   Final diagnoses:  Incontinence  Low back pain with right-sided sciatica, unspecified back pain laterality, unspecified chronicity    New Prescriptions Discharge Medication List as of 07/18/2016  3:13 PM    START taking these medications   Details  predniSONE (STERAPRED UNI-PAK 21 TAB) 10 MG (21) TBPK tablet Take by mouth daily. Take 6 tabs by mouth daily  for 2 days, then 5 tabs for 2 days, then 4 tabs for 2 days, then 3 tabs for 2 days, 2 tabs for 2 days, then 1 tab by mouth daily for 2 days, Starting Tue 07/18/2016, Print       I personally performed the services described in this documentation, which was scribed in my presence. The recorded information has been reviewed and is accurate.    Garlon HatchetLisa M Rodrigo Mcgranahan, PA-C 07/18/16 1715    Jacalyn LefevreJulie Haviland, MD 07/19/16 [phone]

## 2016-07-18 NOTE — ED Notes (Signed)
Bed: WTR7 Expected date:  Expected time:  Means of arrival:  Comments: 

## 2016-07-18 NOTE — ED Notes (Signed)
Bed: WTR6 Expected date:  Expected time:  Means of arrival:  Comments: 

## 2016-07-31 MED FILL — ?ATORVASTATIN 40MG TABLET: 40 | 30 days supply | Qty: 30 | Fill #1

## 2016-07-31 MED FILL — GABAPENTIN 300 MG CAPSULE: 300 | 30 days supply | Qty: 240 | Fill #2

## 2016-07-31 MED FILL — MELOXICAM 7.5 MG TABLET: 7.5 | 30 days supply | Qty: 30 | Fill #5

## 2016-07-31 MED FILL — traMADol HCL 50 MG TABS: 50 | 30 days supply | Qty: 90 | Fill #2

## 2016-08-02 ENCOUNTER — Other Ambulatory Visit: Payer: Self-pay | Admitting: Family Medicine

## 2016-08-02 MED FILL — LISINOPRIL 40 MG TABLET: 40 | 30 days supply | Qty: 30 | Fill #0

## 2016-08-03 MED FILL — DULoxetine HCL 30 MG CPEP: 30 | 30 days supply | Qty: 60 | Fill #0

## 2016-08-08 ENCOUNTER — Ambulatory Visit: Payer: Self-pay | Attending: Family Medicine | Admitting: Family Medicine

## 2016-08-08 ENCOUNTER — Encounter: Payer: Self-pay | Admitting: Family Medicine

## 2016-08-08 VITALS — BP 168/94 | HR 90 | Temp 98.2°F | Ht 64.0 in | Wt 233.8 lb

## 2016-08-08 DIAGNOSIS — Z7982 Long term (current) use of aspirin: Secondary | ICD-10-CM | POA: Insufficient documentation

## 2016-08-08 DIAGNOSIS — I10 Essential (primary) hypertension: Secondary | ICD-10-CM | POA: Insufficient documentation

## 2016-08-08 DIAGNOSIS — F329 Major depressive disorder, single episode, unspecified: Secondary | ICD-10-CM | POA: Insufficient documentation

## 2016-08-08 DIAGNOSIS — G8929 Other chronic pain: Secondary | ICD-10-CM | POA: Insufficient documentation

## 2016-08-08 DIAGNOSIS — M545 Low back pain: Secondary | ICD-10-CM | POA: Insufficient documentation

## 2016-08-08 DIAGNOSIS — J209 Acute bronchitis, unspecified: Secondary | ICD-10-CM | POA: Insufficient documentation

## 2016-08-08 DIAGNOSIS — Z79899 Other long term (current) drug therapy: Secondary | ICD-10-CM | POA: Insufficient documentation

## 2016-08-08 DIAGNOSIS — M5410 Radiculopathy, site unspecified: Secondary | ICD-10-CM

## 2016-08-08 DIAGNOSIS — Z7951 Long term (current) use of inhaled steroids: Secondary | ICD-10-CM | POA: Insufficient documentation

## 2016-08-08 DIAGNOSIS — F419 Anxiety disorder, unspecified: Secondary | ICD-10-CM | POA: Insufficient documentation

## 2016-08-08 DIAGNOSIS — Z87891 Personal history of nicotine dependence: Secondary | ICD-10-CM | POA: Insufficient documentation

## 2016-08-08 DIAGNOSIS — J449 Chronic obstructive pulmonary disease, unspecified: Secondary | ICD-10-CM | POA: Insufficient documentation

## 2016-08-08 MED ORDER — DOXYCYCLINE HYCLATE 100 MG PO TABS: 100.0000 mg | ORAL_TABLET | Freq: Two times a day (BID) | ORAL | 0 refills | Status: DC

## 2016-08-08 MED ORDER — ALBUTEROL SULFATE (2.5 MG/3ML) 0.083% IN NEBU: 5.0000 mg | INHALATION_SOLUTION | Freq: Four times a day (QID) | RESPIRATORY_TRACT | 1 refills | Status: DC | PRN

## 2016-08-08 MED ORDER — BENZONATATE 200 MG PO CAPS: 200.0000 mg | ORAL_CAPSULE | Freq: Two times a day (BID) | ORAL | 0 refills | Status: DC | PRN

## 2016-08-08 MED FILL — ALBUTEROL 0.083 MG/ML SOLN: (2.5 MG/3ML | 8 days supply | Qty: 180 | Fill #0

## 2016-08-08 MED FILL — BENZONATATE 100 MG CAPSULE: 100 | 10 days supply | Qty: 40 | Fill #0

## 2016-08-08 MED FILL — DOXYCYCLINE 100 MG TABLET: 100 | 10 days supply | Qty: 20 | Fill #0

## 2016-08-08 NOTE — Patient Instructions (Addendum)
Michelle Stanley was seen today for pain.  Diagnoses and all orders for this visit:  Radicular low back pain -     Ambulatory referral to Neurosurgery  Acute bronchitis, unspecified organism -     albuterol (PROVENTIL) (2.5 MG/3ML) 0.083% nebulizer solution; Take 6 mLs (5 mg total) by nebulization every 6 (six) hours as needed for wheezing or shortness of breath. -     doxycycline (VIBRA-TABS) 100 MG tablet; Take 1 tablet (100 mg total) by mouth 2 (two) times daily.   Call if breathing symptoms persist despite doxycyline for prednisone burst   Please call back to pain management for trial of spinal injections for back pain as well as radicular pain. We will work on getting you in Neurosurgery (either [facility] and [facility]) which also have patient assistance programs  f/u in 3 weeks for bronchitis   Dr. Armen Pickup

## 2016-08-08 NOTE — Progress Notes (Signed)
Subjective:  Patient ID: Michelle Stanley, female    DOB: [birth year]  Age: 56 y.o. MRN: [id number]  CC: Pain   HPI ZALIAH WISSNER presents for    1.  ED f/u Chronic back: she was evaluated by pain management for chronic back pain. She endorsed bowel and bladder incontinence. She was advised to present to the ED for urgent MRI and neurosurgical evaluation. She went to [facility] ED on 07/18/2016 for right low back pain.  She was prescribed a prednisone taper.  She has chronic back pain that  started in 2009 or 2010. Chronic pain with radicular symptoms that are worsening. Pain is worse on R side. Also with numbness down R leg. No falls. She walks with a cane. Tramadol does help reduce pain. Valium does help with sleep. Gabapentin does help with numbness. She has a lumbar MRI in 2014 that revealed spinal stenosis at L4-L5. Pain 8-10/10. Pain is exacerbated by prolonged walking or prolonged sitting. She walks with a cane. Have not fallen, but feels balance is off.   Lumbar MRI 07/18/2016 IMPRESSION: 1. Facet arthropathy and disc degeneration without progression compared to July 2017. 2. L5-S1 left paracentral disc extrusion with S1 impingement. Mild to moderate left foraminal narrowing. 3. L4-5 right foraminal impingement due to disc bulging and advanced facet arthropathy. Bilateral subarticular recess stenosis with L5 impingement greater on the right. 4. L3-4 right foraminal and far-lateral disc protrusion with L3 contact.   2. Neck pain: started in 2006. Chronic. Worse on R sided. Numbness in R hand. Also pain in both hands. Fingers drawing up. Having swelling at PIP joints. Also with pain and nodules in palm. This has been ongoing for past 2 years. She was evaluated for autoimmune arthritis. ANA was positive with negative titers. Negative rheumatoid factor and CCP. She takes mobic 7.5 mg daily.Having flexion in fingers. Also noticing bluish discoloration in fingers. The same problem of  lack of finger flexion is occurring is the left side.    3. HTN: taking lisinopril. No HA, CP, SOB or leg swelling. No cough.   4. Depression: feels depressed that she has pain and cannot work. She is taking Cymbalta daily. She sleep poorly. She has anxiety that is helped with nightly valium.   Cold symptoms: cough and cold for 2 days . Productive. Having shortness of breath. She is a former smoker. She has COPD. No fever or chills.   Social History  Substance Use Topics  . Smoking status: Former Smoker    Packs/day: 1.00    Years: 30.00    Types: Cigarettes    Quit date: 07/17/2014  . Smokeless tobacco: Never Used  . Alcohol use No    Outpatient Medications Prior to Visit  Medication Sig Dispense Refill  . aspirin 325 MG tablet Take 325 mg by mouth daily.    Marland Kitchen atorvastatin (LIPITOR) 40 MG tablet TAKE 1 TABLET BY MOUTH DAILY 90 tablet 3  . Cholecalciferol (VITAMIN D3) 2000 units TABS Take 2,000 Units by mouth daily. (Patient not taking: Reported on 01/04/2016) 30 tablet 11  . diazepam (VALIUM) 5 MG tablet Take 1 tablet (5 mg total) by mouth at bedtime as needed for anxiety. 30 tablet 5  . DULoxetine (CYMBALTA) 30 MG capsule TAKE 2 CAPSULES BY MOUTH DAILY 60 capsule 3  . DULoxetine (CYMBALTA) 60 MG capsule Take 1 capsule (60 mg total) by mouth daily. 30 capsule 5  . Fluticasone-Salmeterol (ADVAIR DISKUS) 500-50 MCG/DOSE AEPB Inhale 1 puff into the  lungs 2 (two) times daily. 60 each 5  . gabapentin (NEURONTIN) 300 MG capsule TAKE 2 CAPSULES BY MOUTH 4 TIMES DAILY. 240 capsule 3  . levalbuterol (XOPENEX HFA) 45 MCG/ACT inhaler Inhale 2 puffs into the lungs every 6 (six) hours as needed for wheezing. 1 Inhaler 12  . lisinopril (PRINIVIL,ZESTRIL) 40 MG tablet Take 1 tablet (40 mg total) by mouth daily. 30 tablet 5  . meloxicam (MOBIC) 15 MG tablet Take 0.5-1 tablets (7.5-15 mg total) by mouth daily as needed for pain. 30 tablet 2  . predniSONE (STERAPRED UNI-PAK 21 TAB) 10 MG (21) TBPK  tablet Take by mouth daily. Take 6 tabs by mouth daily  for 2 days, then 5 tabs for 2 days, then 4 tabs for 2 days, then 3 tabs for 2 days, 2 tabs for 2 days, then 1 tab by mouth daily for 2 days 42 tablet 0  . Tiotropium Bromide Monohydrate (SPIRIVA RESPIMAT) 2.5 MCG/ACT AERS Inhale 2 puffs into the lungs daily. Rinse and gargle after each use. 4 g 0  . traMADol (ULTRAM) 50 MG tablet Take 1 tablet (50 mg total) by mouth every 8 (eight) hours as needed. 15 tablet 0   No facility-administered medications prior to visit.     ROS Review of Systems  Constitutional: Negative for chills and fever.  HENT: Positive for congestion.   Eyes: Positive for visual disturbance (sees dots in eye sometimes ).  Respiratory: Positive for cough and wheezing. Negative for shortness of breath.   Cardiovascular: Negative for chest pain.  Gastrointestinal: Positive for diarrhea (loose stools ). Negative for abdominal pain and blood in stool.  Musculoskeletal: Positive for arthralgias, back pain, gait problem and neck pain.  Skin: Positive for color change. Negative for rash.  Allergic/Immunologic: Negative for immunocompromised state.  Neurological: Positive for numbness (in arms and legs ). Negative for dizziness and light-headedness.  Hematological: Negative for adenopathy. Does not bruise/bleed easily.  Psychiatric/Behavioral: Positive for dysphoric mood and sleep disturbance. Negative for suicidal ideas. The patient is nervous/anxious.     Objective:  BP (!) 168/94   Pulse 90   Temp 98.2 F (36.8 C) (Oral)   Ht  (1.626 m)   Wt 233 lb 12.8 oz (106.1 kg)   SpO2 95%   BMI 40.13 kg/m   BP/Weight 08/08/2016 07/18/2016 07/14/2016  Systolic BP 168 155 136  Diastolic BP 94 79 82  Wt. (Lbs) 233.8 230 -  BMI 40.13 39.48 -   Physical Exam  Constitutional: She is oriented to person, place, and time. She appears well-developed and well-nourished. No distress.  HENT:  Head: Normocephalic and atraumatic.    Neck: Decreased range of motion present.  Cardiovascular: Normal rate, regular rhythm, normal heart sounds and intact distal pulses.   Pulmonary/Chest: Effort normal and breath sounds normal.  Musculoskeletal: She exhibits no edema.  Lymphadenopathy:    She has no cervical adenopathy.  Neurological: She is alert and oriented to person, place, and time. She displays normal reflexes. No cranial nerve deficit. She exhibits normal muscle tone. Coordination normal.  Weakness in R leg compared to L    Skin: Skin is warm and dry. No rash noted.  Psychiatric: She has a normal mood and affect.   Lab Results  Component Value Date   HGBA1C 5.70 06/11/2015  treated with albuterol nebulizer treatment  Depression screen [facility] 2/9 07/14/2016 06/27/2016 06/06/2016  Decreased Interest Down, Depressed, Hopeless PHQ - 2 Score  Altered sleeping Tired, decreased energy Change in appetite Feeling bad or failure about yourself  Trouble concentrating Moving slowly or fidgety/restless Suicidal thoughts 0 0 0  PHQ-9 Score Some recent data might be hidden   GAD 7 : Generalized Anxiety Score 06/27/2016 06/06/2016 01/04/2016 10/12/2015  Nervous, Anxious, on Edge 0  Control/stop worrying 0  Worry too much - different things 0  Trouble relaxing 0  Restless 0  Easily annoyed or irritable 0  Afraid - awful might happen 0  Total GAD 7 Score 0     Assessment & Plan:  Laytoya was seen today for pain.  Diagnoses and all orders for this visit:  Radicular low back pain -     Ambulatory referral to Neurosurgery  Acute bronchitis, unspecified organism -     albuterol (PROVENTIL) (2.5 MG/3ML) 0.083% nebulizer solution; Take 6 mLs (5 mg total) by nebulization every 6 (six) hours as needed for wheezing or shortness of breath. -     doxycycline (VIBRA-TABS) 100 MG tablet; Take 1 tablet (100 mg  total) by mouth 2 (two) times daily. -     benzonatate (TESSALON) 200 MG capsule; Take 1 capsule (200 mg total) by mouth 2 (two) times daily as needed for cough.   There are no diagnoses linked to this encounter. There are no diagnoses linked to this encounter.  No orders of the defined types were placed in this encounter.   Follow-up: Return in about 3 weeks (around 08/29/2016) for bronchitis .   Dessa Phi MD

## 2016-08-10 NOTE — Assessment & Plan Note (Signed)
Chronic low back pain with radicular symptoms Referral to neurosurgery placed

## 2016-08-10 NOTE — Assessment & Plan Note (Signed)
Acute mild bronchitis Plan: Doxycycline Tessalon for cough Home neb treatments prn

## 2016-08-29 ENCOUNTER — Other Ambulatory Visit: Payer: Self-pay | Admitting: Family Medicine

## 2016-08-29 DIAGNOSIS — M5410 Radiculopathy, site unspecified: Secondary | ICD-10-CM

## 2016-08-29 DIAGNOSIS — M5412 Radiculopathy, cervical region: Secondary | ICD-10-CM

## 2016-08-29 MED FILL — GABAPENTIN 300 MG CAPSULE: 300 | 30 days supply | Qty: 240 | Fill #3

## 2016-08-29 MED FILL — ATORVASTATIN 40 MG TABLET: 40 | 30 days supply | Qty: 30 | Fill #2

## 2016-08-29 MED FILL — traMADol HCL 50 MG TABS: 50 | 30 days supply | Qty: 90 | Fill #3

## 2016-08-30 MED FILL — ?MELOXICAM 7.5 MG TABLET: 7.5 | 30 days supply | Qty: 30 | Fill #0

## 2016-08-31 ENCOUNTER — Encounter: Payer: Self-pay | Admitting: Family Medicine

## 2016-08-31 ENCOUNTER — Ambulatory Visit: Payer: Self-pay | Attending: Family Medicine | Admitting: Family Medicine

## 2016-08-31 VITALS — BP 109/67 | HR 90 | Temp 98.1°F | Ht 64.0 in | Wt 230.2 lb

## 2016-08-31 DIAGNOSIS — M5410 Radiculopathy, site unspecified: Secondary | ICD-10-CM

## 2016-08-31 DIAGNOSIS — Z7982 Long term (current) use of aspirin: Secondary | ICD-10-CM | POA: Insufficient documentation

## 2016-08-31 DIAGNOSIS — Z79899 Other long term (current) drug therapy: Secondary | ICD-10-CM | POA: Insufficient documentation

## 2016-08-31 DIAGNOSIS — J209 Acute bronchitis, unspecified: Secondary | ICD-10-CM | POA: Insufficient documentation

## 2016-08-31 DIAGNOSIS — N644 Mastodynia: Secondary | ICD-10-CM | POA: Insufficient documentation

## 2016-08-31 DIAGNOSIS — M5412 Radiculopathy, cervical region: Secondary | ICD-10-CM | POA: Insufficient documentation

## 2016-08-31 MED ORDER — DIAZEPAM 5 MG PO TABS: 5.0000 mg | ORAL_TABLET | Freq: Every evening | ORAL | 3 refills | Status: AC | PRN

## 2016-08-31 MED ORDER — TRAMADOL HCL 50 MG PO TABS
50.0000 mg | ORAL_TABLET | Freq: Three times a day (TID) | ORAL | 2 refills | Status: DC | PRN
Start: 2016-08-31 — End: 2016-10-31

## 2016-08-31 MED ORDER — DULOXETINE HCL 30 MG PO CPEP: 60.0000 mg | ORAL_CAPSULE | Freq: Every day | ORAL | 5 refills | Status: AC

## 2016-08-31 MED FILL — DULoxetine HCL 30 MG CPEP: 30 | 30 days supply | Qty: 60 | Fill #0

## 2016-08-31 NOTE — Patient Instructions (Addendum)
Michelle Stanley was seen today for bronchitis.  Diagnoses and all orders for this visit:  Breast pain, left -     MM DIAG BREAST TOMO BILATERAL; Future -     US BREAST LTD UNI LEFT INC AXILLA; Future  Radicular low back pain -     DULoxetine (CYMBALTA) 30 MG capsule; Take 2 capsules (60 mg total) by mouth daily. -     traMADol (ULTRAM) 50 MG tablet; Take 1 tablet (50 mg total) by mouth every 8 (eight) hours as needed. -     diazepam (VALIUM) 5 MG tablet; Take 1 tablet (5 mg total) by mouth at bedtime as needed for anxiety.  Cervical radicular pain -     DULoxetine (CYMBALTA) 30 MG capsule; Take 2 capsules (60 mg total) by mouth daily. -     traMADol (ULTRAM) 50 MG tablet; Take 1 tablet (50 mg total) by mouth every 8 (eight) hours as needed. -     diazepam (VALIUM) 5 MG tablet; Take 1 tablet (5 mg total) by mouth at bedtime as needed for anxiety.  Please call Stoney BangSabrina Holland, [phone],  with the [facility] (breast and cervical cancer control program) the orders are in but the diagnostic mammogram and ultrasound still need to be scheduled. There was no answer when we called, but we have requested a call back.    f/u in 8 weeks for chronic back and neck pain   Dr. Armen PickupFunches

## 2016-08-31 NOTE — Assessment & Plan Note (Signed)
A: chronic pain with depression and anxiety symptoms  Current regimen Mobic 7.5 mg daily  Cymybalta 60 mg daily Tramadol 50 mg  up to TID Valium 5 mg nightly

## 2016-08-31 NOTE — Assessment & Plan Note (Signed)
Resolved

## 2016-08-31 NOTE — Assessment & Plan Note (Signed)
Left breast pains Swelling has resolved Diagnostic mammogram and L breast ultrasound ordered

## 2016-08-31 NOTE — Progress Notes (Signed)
Subjective:  Patient ID: Michelle Stanley, female    DOB: Dec 21, 1960  Age: 56 y.o. MRN: [id number]  CC: Bronchitis   HPI Michelle Stanley presents for    1.  Chronic back: she was evaluated by pain management for chronic back pain. She endorsed bowel and bladder incontinence. She was advised to present to the ED for urgent MRI and neurosurgical evaluation. [facility]18/2016 for right low back pain.  She was prescribed a prednisone taper.  She has chronic back pain that  started in 2009 or 2010. Chronic pain with radicular symptoms that are worsening. Pain is worse on R side. Also with numbness down R leg. No falls. She walks with a cane. Tramadol does help reduce pain. Valium does help with sleep. Gabapentin does help with numbness. She has a lumbar MRI in 2014 that revealed spinal stenosis at L4-L5. Pain 8-10/10. Pain is exacerbated by prolonged walking or prolonged sitting. She walks with a cane. Have not fallen, but feels balance is off.   Lumbar MRI 07/18/2016 IMPRESSION: 1. Facet arthropathy and disc degeneration without progression compared to July 2017. 2. L5-S1 left paracentral disc extrusion with S1 impingement. Mild to moderate left foraminal narrowing. 3. L4-5 right foraminal impingement due to disc bulging and advanced facet arthropathy. Bilateral subarticular recess stenosis with L5 impingement greater on the right. 4. L3-4 right foraminal and far-lateral disc protrusion with L3 contact.   2. Neck pain: started in 2006. Chronic. Worse on R sided. Numbness in R hand. Also pain in both hands. Fingers drawing up. Having swelling at PIP joints. Also with pain and nodules in palm. This has been ongoing for past 2 years. She was evaluated for autoimmune arthritis. ANA was positive with negative titers. Negative rheumatoid factor and CCP. She takes mobic 7.5 mg daily.Having flexion in fingers. Also noticing bluish discoloration in fingers. The same problem of  lack of finger flexion is occurring is the left side.    3. HTN: taking lisinopril. No HA, CP, SOB or leg swelling. No cough.   4. Depression: feels depressed that she has pain and cannot work. She is taking Cymbalta daily 60 m gdaily. She sleep poorly. She has anxiety that is helped with nightly valium 5 mg nightly.   5. Cold symptoms: improved with doxycyline. She still uses albuterol nebulizer treatments as needed. Denies chest pains and shortness of breath.   6.  L breast pain and swelling: started last week. Swelling has since resolved. It was around her upper nipple. She has hx of L breast cyst 25-30 years ago s/p cystectomy with drain placement. She still has some residual diffuse breast soreness.   Social History  Substance Use Topics  . Smoking status: Former Smoker    Packs/day: 1.00    Years: 30.00    Types: Cigarettes    Quit date: 07/17/2014  . Smokeless tobacco: Never Used  . Alcohol use No    Outpatient Medications Prior to Visit  Medication Sig Dispense Refill  . albuterol (PROVENTIL) (2.5 MG/3ML) 0.083% nebulizer solution Take 6 mLs (5 mg total) by nebulization every 6 (six) hours as needed for wheezing or shortness of breath. 150 mL 1  . aspirin 325 MG tablet Take 325 mg by mouth daily.    Marland Kitchen. atorvastatin (LIPITOR) 40 MG tablet TAKE 1 TABLET BY MOUTH DAILY 90 tablet 3  . Cholecalciferol (VITAMIN D3) 2000 units TABS Take 2,000 Units by mouth daily. 30 tablet 11  .  diazepam (VALIUM) 5 MG tablet Take 1 tablet (5 mg total) by mouth at bedtime as needed for anxiety. 30 tablet 5  . DULoxetine (CYMBALTA) 30 MG capsule TAKE 2 CAPSULES BY MOUTH DAILY 60 capsule 3  . Fluticasone-Salmeterol (ADVAIR DISKUS) 500-50 MCG/DOSE AEPB Inhale 1 puff into the lungs 2 (two) times daily. 60 each 5  . gabapentin (NEURONTIN) 300 MG capsule TAKE 2 CAPSULES BY MOUTH 4 TIMES DAILY. 240 capsule 3  . levalbuterol (XOPENEX HFA) 45 MCG/ACT inhaler Inhale 2 puffs into the lungs every 6 (six) hours as  needed for wheezing. 1 Inhaler 12  . lisinopril (PRINIVIL,ZESTRIL) 40 MG tablet Take 1 tablet (40 mg total) by mouth daily. 30 tablet 5  . meloxicam (MOBIC) 7.5 MG tablet TAKE 1 TABLET BY MOUTH DAILY AS NEEDED FOR PAIN. 30 tablet 5  . Tiotropium Bromide Monohydrate (SPIRIVA RESPIMAT) 2.5 MCG/ACT AERS Inhale 2 puffs into the lungs daily. Rinse and gargle after each use. 4 g 0  . traMADol (ULTRAM) 50 MG tablet Take 1 tablet (50 mg total) by mouth every 8 (eight) hours as needed. 15 tablet 0  . benzonatate (TESSALON) 200 MG capsule Take 1 capsule (200 mg total) by mouth 2 (two) times daily as needed for cough. (Patient not taking: Reported on 08/31/2016) 20 capsule 0  . doxycycline (VIBRA-TABS) 100 MG tablet Take 1 tablet (100 mg total) by mouth 2 (two) times daily. (Patient not taking: Reported on 08/31/2016) 20 tablet 0   No facility-administered medications prior to visit.     ROS Review of Systems  Constitutional: Negative for chills and fever.  HENT: Negative for congestion.   Eyes: Positive for visual disturbance (sees dots in eye sometimes ).  Respiratory: Positive for cough. Negative for shortness of breath and wheezing.   Cardiovascular: Negative for chest pain.  Gastrointestinal: Negative for abdominal pain, blood in stool and diarrhea (loose stools ).  Musculoskeletal: Positive for arthralgias, back pain, gait problem and neck pain.  Skin: Positive for color change. Negative for rash.  Allergic/Immunologic: Negative for immunocompromised state.  Neurological: Positive for numbness (in arms and legs ). Negative for dizziness and light-headedness.  Hematological: Negative for adenopathy. Does not bruise/bleed easily.  Psychiatric/Behavioral: Positive for dysphoric mood and sleep disturbance. Negative for suicidal ideas. The patient is nervous/anxious.     Objective:  BP 109/67   Pulse 90   Temp 98.1 F (36.7 C) (Oral)   Ht 5\' 4"  (1.626 m)   Wt 230 lb 3.2 oz (104.4 kg)   SpO2 93%    BMI 39.51 kg/m   BP/Weight 08/31/2016 08/08/2016 07/18/2016  Systolic BP 109 168 155  Diastolic BP 67 94 79  Wt. (Lbs) 230.2 233.8 230  BMI 39.51 40.13 39.48   Physical Exam  Constitutional: She is oriented to person, place, and time. She appears well-developed and well-nourished. No distress.  HENT:  Head: Normocephalic and atraumatic.  Neck: Decreased range of motion present.  Cardiovascular: Normal rate, regular rhythm, normal heart sounds and intact distal pulses.   Pulmonary/Chest: Effort normal and breath sounds normal. No respiratory distress. She has no wheezes. She has no rales. She exhibits no tenderness.    Musculoskeletal: She exhibits no edema.  Lymphadenopathy:    She has no cervical adenopathy.  Neurological: She is alert and oriented to person, place, and time. She displays normal reflexes. No cranial nerve deficit. She exhibits normal muscle tone. Coordination normal.  Weakness in R leg compared to L    Skin: Skin is  warm and dry. No rash noted.  Psychiatric: She has a normal mood and affect.   Lab Results  Component Value Date   HGBA1C 5.70 06/11/2015    Depression screen [facility] 2/9 08/31/2016 07/14/2016 06/27/2016  Decreased Interest 2 2 2   Down, Depressed, Hopeless 2 2 2   PHQ - 2 Score 4 4 4   Altered sleeping 2 3 2   Tired, decreased energy 3 2 2   Change in appetite 2 2 2   Feeling bad or failure about yourself  3 2 2   Trouble concentrating 2 2 2   Moving slowly or fidgety/restless 3 3 3   Suicidal thoughts 0 0 0  PHQ-9 Score 19 18 17   Some recent data might be hidden   GAD 7 : Generalized Anxiety Score 08/31/2016 06/27/2016 06/06/2016 01/04/2016  Nervous, Anxious, on Edge 3 3 3 2   Control/stop worrying 3 3 3 2   Worry too much - different things 2 2 3 2   Trouble relaxing 2 2 2 2   Restless 2 3 2 2   Easily annoyed or irritable 2 3 2 2   Afraid - awful might happen 3 2 1 1   Total GAD 7 Score 17 18 16 13      Assessment & Plan:  Aarini was seen today for  bronchitis.  Diagnoses and all orders for this visit:  Breast pain, left -     MM DIAG BREAST TOMO BILATERAL; Future -     US BREAST LTD UNI LEFT INC AXILLA; Future  Radicular low back pain -     DULoxetine (CYMBALTA) 30 MG capsule; Take 2 capsules (60 mg total) by mouth daily. -     traMADol (ULTRAM) 50 MG tablet; Take 1 tablet (50 mg total) by mouth every 8 (eight) hours as needed. -     diazepam (VALIUM) 5 MG tablet; Take 1 tablet (5 mg total) by mouth at bedtime as needed for anxiety.  Cervical radicular pain -     DULoxetine (CYMBALTA) 30 MG capsule; Take 2 capsules (60 mg total) by mouth daily. -     traMADol (ULTRAM) 50 MG tablet; Take 1 tablet (50 mg total) by mouth every 8 (eight) hours as needed. -     diazepam (VALIUM) 5 MG tablet; Take 1 tablet (5 mg total) by mouth at bedtime as needed for anxiety.  Acute bronchitis, unspecified organism   There are no diagnoses linked to this encounter. There are no diagnoses linked to this encounter.  No orders of the defined types were placed in this encounter.   Follow-up: Return in about 8 weeks (around 10/26/2016) for chronic pain in back and neck .   Dessa Phi MD

## 2016-09-05 MED FILL — diazePAM 5 MG TABS: 5 | 30 days supply | Qty: 30 | Fill #0

## 2016-09-06 ENCOUNTER — Encounter: Payer: Self-pay | Admitting: Family Medicine

## 2016-10-03 ENCOUNTER — Other Ambulatory Visit: Payer: Self-pay | Admitting: Family Medicine

## 2016-10-03 DIAGNOSIS — M5410 Radiculopathy, site unspecified: Secondary | ICD-10-CM

## 2016-10-03 DIAGNOSIS — M5412 Radiculopathy, cervical region: Secondary | ICD-10-CM

## 2016-10-03 MED FILL — ?DULOXETINE HCL DR 30 MG CA: 30 MG | 30 days supply | Qty: 60 | Fill #1

## 2016-10-03 MED FILL — MELOXICAM 7.5 MG TABLET: 7.5 | 30 days supply | Qty: 30 | Fill #1

## 2016-10-03 MED FILL — ?ATORVASTATIN 40MG TABLET: 40 | 30 days supply | Qty: 30 | Fill #3

## 2016-10-04 MED FILL — GABAPENTIN 300 MG CAPSULE: 300 | 30 days supply | Qty: 240 | Fill #0

## 2016-10-09 MED FILL — traMADol HCL 50 MG TABS: 50 | 30 days supply | Qty: 90 | Fill #0

## 2016-10-18 MED FILL — diazePAM 5 MG TABS: 5 | 30 days supply | Qty: 30 | Fill #1

## 2016-10-31 ENCOUNTER — Encounter: Payer: Self-pay | Admitting: Family Medicine

## 2016-10-31 ENCOUNTER — Ambulatory Visit: Payer: Self-pay | Attending: Family Medicine | Admitting: Family Medicine

## 2016-10-31 VITALS — BP 128/74 | HR 93 | Temp 98.3°F | Ht 64.0 in | Wt 230.4 lb

## 2016-10-31 DIAGNOSIS — J209 Acute bronchitis, unspecified: Secondary | ICD-10-CM | POA: Insufficient documentation

## 2016-10-31 DIAGNOSIS — R7309 Other abnormal glucose: Secondary | ICD-10-CM | POA: Insufficient documentation

## 2016-10-31 DIAGNOSIS — Z7982 Long term (current) use of aspirin: Secondary | ICD-10-CM | POA: Insufficient documentation

## 2016-10-31 DIAGNOSIS — M5410 Radiculopathy, site unspecified: Secondary | ICD-10-CM

## 2016-10-31 DIAGNOSIS — I10 Essential (primary) hypertension: Secondary | ICD-10-CM | POA: Insufficient documentation

## 2016-10-31 DIAGNOSIS — J410 Simple chronic bronchitis: Secondary | ICD-10-CM | POA: Insufficient documentation

## 2016-10-31 DIAGNOSIS — M545 Low back pain: Secondary | ICD-10-CM | POA: Insufficient documentation

## 2016-10-31 DIAGNOSIS — Z87891 Personal history of nicotine dependence: Secondary | ICD-10-CM | POA: Insufficient documentation

## 2016-10-31 DIAGNOSIS — G8929 Other chronic pain: Secondary | ICD-10-CM | POA: Insufficient documentation

## 2016-10-31 DIAGNOSIS — M5412 Radiculopathy, cervical region: Secondary | ICD-10-CM | POA: Insufficient documentation

## 2016-10-31 DIAGNOSIS — Z79899 Other long term (current) drug therapy: Secondary | ICD-10-CM | POA: Insufficient documentation

## 2016-10-31 LAB — POCT GLYCOSYLATED HEMOGLOBIN (HGB A1C): Hemoglobin A1C: 5.9

## 2016-10-31 MED ORDER — TRAMADOL HCL 50 MG PO TABS: 50.0000 mg | ORAL_TABLET | Freq: Three times a day (TID) | ORAL | 2 refills | Status: AC | PRN

## 2016-10-31 MED ORDER — FLUTICASONE-SALMETEROL 500-50 MCG/DOSE IN AEPB: 1.0000 | INHALATION_SPRAY | Freq: Two times a day (BID) | RESPIRATORY_TRACT | 5 refills | Status: AC

## 2016-10-31 MED ORDER — ALBUTEROL SULFATE (2.5 MG/3ML) 0.083% IN NEBU: 5.0000 mg | INHALATION_SOLUTION | Freq: Four times a day (QID) | RESPIRATORY_TRACT | 1 refills | Status: AC | PRN

## 2016-10-31 MED ORDER — LISINOPRIL 40 MG PO TABS: 40.0000 mg | ORAL_TABLET | Freq: Every day | ORAL | 5 refills | Status: AC

## 2016-10-31 MED ORDER — PREDNISONE 10 MG PO TABS: ORAL_TABLET | ORAL | 0 refills | Status: AC

## 2016-10-31 MED ORDER — TIOTROPIUM BROMIDE MONOHYDRATE 2.5 MCG/ACT IN AERS: 2.0000 | INHALATION_SPRAY | Freq: Every day | RESPIRATORY_TRACT | 5 refills | Status: AC

## 2016-10-31 MED FILL — ?PREDNISONE 10 MG TABLET: 10 | 9 days supply | Qty: 21 | Fill #0

## 2016-10-31 NOTE — Patient Instructions (Addendum)
Michelle Stanley was seen today for back pain.  Diagnoses and all orders for this visit:  Elevated hemoglobin A1c -     POCT glycosylated hemoglobin (Hb A1C)  Simple chronic bronchitis (HCC) -     predniSONE (DELTASONE) 10 MG tablet; Take by mouth 4 tablets for 2 days, 3 tablets for 2 days, 2 tablets for 2 days, 1 tablet for 3 days then STOP -     Fluticasone-Salmeterol (ADVAIR DISKUS) 500-50 MCG/DOSE AEPB; Inhale 1 puff into the lungs 2 (two) times daily. -     Tiotropium Bromide Monohydrate (SPIRIVA RESPIMAT) 2.5 MCG/ACT AERS; Inhale 2 puffs into the lungs daily. Rinse and gargle after each use. -     Ambulatory referral to Pulmonology  Acute bronchitis, unspecified organism -     albuterol (PROVENTIL) (2.5 MG/3ML) 0.083% nebulizer solution; Take 6 mLs (5 mg total) by nebulization every 6 (six) hours as needed for wheezing or shortness of breath.  Essential hypertension -     lisinopril (PRINIVIL,ZESTRIL) 40 MG tablet; Take 1 tablet (40 mg total) by mouth daily.  Radicular low back pain -     traMADol (ULTRAM) 50 MG tablet; Take 1 tablet (50 mg total) by mouth every 8 (eight) hours as needed.  Cervical radicular pain -     traMADol (ULTRAM) 50 MG tablet; Take 1 tablet (50 mg total) by mouth every 8 (eight) hours as needed.  you do not have diabetes. Your A1c is elevated a bit at 5.9. Normal is 5.7 or less Low sugar diet and as much exercise as tolerated is recommended to reduce A1c  Prednisone taper for bronchitis flare Add spiriva inhaler, this is one you did well with when you were give a sample by pulmonology in 2016.   I also recommend a follow up appointment with pulmonology. I have placed a referral  F/u in 2 months to meet Dr. Laural BenesJohnson, new PCP for COPD and neck and back pain   Dr. Armen PickupFunches

## 2016-10-31 NOTE — Progress Notes (Signed)
Subjective:  Patient ID: Michelle Stanley, female    DOB: 16-Jul-1960  Age: 56 y.o. MRN: [id number]  CC: Back Pain   HPI SYD MANGES presents for    1.  Chronic back: she has chronic back pain. She walks with a cane. She reports morning stiffness in her low back. She also has aches and fatigue in the afternoon. She reports her current pain control regimens works fairly well.   Lumbar MRI 07/18/2016 IMPRESSION: 1. Facet arthropathy and disc degeneration without progression compared to July 2017. 2. L5-S1 left paracentral disc extrusion with S1 impingement. Mild to moderate left foraminal narrowing. 3. L4-5 right foraminal impingement due to disc bulging and advanced facet arthropathy. Bilateral subarticular recess stenosis with L5 impingement greater on the right. 4. L3-4 right foraminal and far-lateral disc protrusion with L3 contact.   2. Neck pain: started in 2006. Chronic. Worse on R sided. Numbness in R hand. Also pain in both hands. Fingers drawing up. Having swelling at PIP joints. Also with pain and nodules in palm. This has been ongoing for past 2 years. She was evaluated for autoimmune arthritis. ANA was positive with negative titers. Negative rheumatoid factor and CCP. She takes mobic 7.5 mg daily.Having flexion in fingers. Also noticing bluish discoloration in fingers. The same problem of lack of finger flexion is occurring is the left side.    3. Cold symptoms: improved with doxycyline. But persistent cough for past 2 months with mild shortness of breath. She is using Advair.  She is not using Spiriva. She still uses albuterol nebulizer treatments as needed. Denies chest pains.    Social History  Substance Use Topics  . Smoking status: Former Smoker    Packs/day: 1.00    Years: 30.00    Types: Cigarettes    Quit date: 07/17/2014  . Smokeless tobacco: Never Used  . Alcohol use No    Outpatient Medications Prior to Visit  Medication Sig Dispense Refill  .  albuterol (PROVENTIL) (2.5 MG/3ML) 0.083% nebulizer solution Take 6 mLs (5 mg total) by nebulization every 6 (six) hours as needed for wheezing or shortness of breath. 150 mL 1  . aspirin 325 MG tablet Take 325 mg by mouth daily.    Marland Kitchen atorvastatin (LIPITOR) 40 MG tablet TAKE 1 TABLET BY MOUTH DAILY 90 tablet 3  . Cholecalciferol (VITAMIN D3) 2000 units TABS Take 2,000 Units by mouth daily. 30 tablet 11  . diazepam (VALIUM) 5 MG tablet Take 1 tablet (5 mg total) by mouth at bedtime as needed for anxiety. 30 tablet 3  . DULoxetine (CYMBALTA) 30 MG capsule Take 2 capsules (60 mg total) by mouth daily. 60 capsule 5  . Fluticasone-Salmeterol (ADVAIR DISKUS) 500-50 MCG/DOSE AEPB Inhale 1 puff into the lungs 2 (two) times daily. 60 each 5  . gabapentin (NEURONTIN) 300 MG capsule TAKE 2 CAPSULES BY MOUTH 4 TIMES DAILY. 240 capsule 3  . levalbuterol (XOPENEX HFA) 45 MCG/ACT inhaler Inhale 2 puffs into the lungs every 6 (six) hours as needed for wheezing. 1 Inhaler 12  . lisinopril (PRINIVIL,ZESTRIL) 40 MG tablet Take 1 tablet (40 mg total) by mouth daily. 30 tablet 5  . meloxicam (MOBIC) 7.5 MG tablet TAKE 1 TABLET BY MOUTH DAILY AS NEEDED FOR PAIN. 30 tablet 5  . Tiotropium Bromide Monohydrate (SPIRIVA RESPIMAT) 2.5 MCG/ACT AERS Inhale 2 puffs into the lungs daily. Rinse and gargle after each use. 4 g 0  . traMADol (ULTRAM) 50 MG tablet Take 1 tablet (50  mg total) by mouth every 8 (eight) hours as needed. 90 tablet 2   No facility-administered medications prior to visit.     ROS Review of Systems  Constitutional: Negative for chills and fever.  HENT: Negative for congestion.   Eyes: Positive for visual disturbance (sees dots in eye sometimes ).  Respiratory: Positive for cough. Negative for shortness of breath and wheezing.   Cardiovascular: Negative for chest pain.  Gastrointestinal: Negative for abdominal pain, blood in stool and diarrhea (loose stools ).  Musculoskeletal: Positive for  arthralgias, back pain, gait problem and neck pain.  Skin: Positive for color change. Negative for rash.  Allergic/Immunologic: Negative for immunocompromised state.  Neurological: Positive for numbness (in arms and legs ). Negative for dizziness and light-headedness.  Hematological: Negative for adenopathy. Does not bruise/bleed easily.  Psychiatric/Behavioral: Positive for dysphoric mood and sleep disturbance. Negative for suicidal ideas. The patient is nervous/anxious.     Objective:  BP 128/74   Pulse 93   Temp 98.3 F (36.8 C) (Oral)   Ht 5\' 4"  (1.626 m)   Wt 230 lb 6.4 oz (104.5 kg)   SpO2 94%   BMI 39.55 kg/m   BP/Weight 10/31/2016 08/31/2016 08/08/2016  Systolic BP 128 109 168  Diastolic BP 74 67 94  Wt. (Lbs) 230.4 230.2 233.8  BMI 39.55 39.51 40.13   Physical Exam  Constitutional: She is oriented to person, place, and time. She appears well-developed and well-nourished. No distress.  HENT:  Head: Normocephalic and atraumatic.  Neck: Decreased range of motion present.  Cardiovascular: Normal rate, regular rhythm, normal heart sounds and intact distal pulses.   Pulmonary/Chest: Effort normal and breath sounds normal. No respiratory distress. She has no wheezes. She has no rales.  Musculoskeletal: She exhibits no edema.  Lymphadenopathy:    She has no cervical adenopathy.  Neurological: She is alert and oriented to person, place, and time. She displays normal reflexes. No cranial nerve deficit. She exhibits normal muscle tone. Coordination normal.  Weakness in R leg compared to L    Skin: Skin is warm and dry. No rash noted.  Psychiatric: She has a normal mood and affect.   Lab Results  Component Value Date   HGBA1C 5.70 06/11/2015   Lab Results  Component Value Date   HGBA1C 5.9 10/31/2016    Depression screen [facility] 2/9 10/31/2016 08/31/2016 07/14/2016  Decreased Interest 2 2 2   Down, Depressed, Hopeless 2 2 2   PHQ - 2 Score 4 4 4   Altered sleeping 2 2 3     Tired, decreased energy 2 3 2   Change in appetite 2 2 2   Feeling bad or failure about yourself  2 3 2   Trouble concentrating 2 2 2   Moving slowly or fidgety/restless 2 3 3   Suicidal thoughts 2 0 0  PHQ-9 Score 18 19 18   Some recent data might be hidden   GAD 7 : Generalized Anxiety Score 10/31/2016 08/31/2016 06/27/2016 06/06/2016  Nervous, Anxious, on Edge 2 3 3 3   Control/stop worrying 2 3 3 3   Worry too much - different things 2 2 2 3   Trouble relaxing 2 2 2 2   Restless 2 2 3 2   Easily annoyed or irritable 2 2 3 2   Afraid - awful might happen 2 3 2 1   Total GAD 7 Score 14 17 18 16      Assessment & Plan:  Grover was seen today for back pain.  Diagnoses and all orders for this visit:  Elevated hemoglobin A1c -  POCT glycosylated hemoglobin (Hb A1C)  Simple chronic bronchitis (HCC) -     predniSONE (DELTASONE) 10 MG tablet; Take by mouth 4 tablets for 2 days, 3 tablets for 2 days, 2 tablets for 2 days, 1 tablet for 3 days then STOP -     Fluticasone-Salmeterol (ADVAIR DISKUS) 500-50 MCG/DOSE AEPB; Inhale 1 puff into the lungs 2 (two) times daily. -     Tiotropium Bromide Monohydrate (SPIRIVA RESPIMAT) 2.5 MCG/ACT AERS; Inhale 2 puffs into the lungs daily. Rinse and gargle after each use. -     Ambulatory referral to Pulmonology  Acute bronchitis, unspecified organism -     albuterol (PROVENTIL) (2.5 MG/3ML) 0.083% nebulizer solution; Take 6 mLs (5 mg total) by nebulization every 6 (six) hours as needed for wheezing or shortness of breath.  Essential hypertension -     lisinopril (PRINIVIL,ZESTRIL) 40 MG tablet; Take 1 tablet (40 mg total) by mouth daily.  Radicular low back pain -     traMADol (ULTRAM) 50 MG tablet; Take 1 tablet (50 mg total) by mouth every 8 (eight) hours as needed.  Cervical radicular pain -     traMADol (ULTRAM) 50 MG tablet; Take 1 tablet (50 mg total) by mouth every 8 (eight) hours as needed.   Follow-up: Return in about 2 months (around  01/01/2017) for back pain, neck pain, COPD.   Dessa PhiJosalyn Horst Ostermiller MD

## 2016-11-01 MED FILL — LISINOPRIL 40 MG TABLET: 40 | 30 days supply | Qty: 30 | Fill #0

## 2016-11-01 MED FILL — ADVAIR 500/50 DISKUS: 500-50 | 30 days supply | Qty: 60 | Fill #0

## 2016-11-01 MED FILL — ALBUTEROL 0.083% INHAL SOLN: (2.5 MG/3ML | 4 days supply | Qty: 90 | Fill #0

## 2016-11-01 MED FILL — SPIRIVA RESPIMAT INHAL SPRY: 2.5 | 30 days supply | Qty: 4 | Fill #0

## 2016-11-01 NOTE — Assessment & Plan Note (Signed)
COPD with worsening cough x 2 months Compliant with Advair, prn xopenex or albuterol nebulizer treatment   Suspected mild COPD flare Prednisone taper Refilled Spiriva Referral back to pulmonology

## 2016-11-01 NOTE — Assessment & Plan Note (Addendum)
Chronic pain Fairly well controlled with tramadol, mobic, gabapentin, Cymbalta and valium  Continue current regimen Handicap parking placard form completed

## 2016-11-16 MED FILL — GABAPENTIN 300 MG CAPSULE: 300 | 30 days supply | Qty: 240 | Fill #1

## 2016-11-16 MED FILL — ?DULOXETINE HCL DR 30 MG CA: 30 MG | 30 days supply | Qty: 60 | Fill #2

## 2016-11-16 MED FILL — traMADol HCL 50 MG TABS: 50 | 30 days supply | Qty: 90 | Fill #1

## 2016-11-16 MED FILL — MELOXICAM 7.5 MG TABLET: 7.5 | 30 days supply | Qty: 30 | Fill #2

## 2016-12-18 ENCOUNTER — Other Ambulatory Visit: Payer: Self-pay | Admitting: Family Medicine

## 2016-12-18 DIAGNOSIS — M5410 Radiculopathy, site unspecified: Secondary | ICD-10-CM

## 2016-12-18 DIAGNOSIS — M5412 Radiculopathy, cervical region: Secondary | ICD-10-CM

## 2016-12-18 MED FILL — traMADol HCL 50 MG TABS: 50 | 30 days supply | Qty: 90 | Fill #2

## 2016-12-18 MED FILL — GABAPENTIN 300 MG CAPSULE: 300 | 30 days supply | Qty: 240 | Fill #2

## 2016-12-26 MED FILL — SPIRIVA RESPIMAT INHAL SPRY: 2.5 | 30 days supply | Qty: 4 | Fill #1

## 2017-01-01 ENCOUNTER — Ambulatory Visit: Payer: Medicaid Other | Attending: Internal Medicine | Admitting: Internal Medicine

## 2017-01-01 ENCOUNTER — Encounter: Payer: Self-pay | Admitting: Internal Medicine

## 2017-01-01 VITALS — BP 145/81 | HR 80 | Temp 98.1°F | Resp 16 | Wt 231.4 lb

## 2017-01-01 DIAGNOSIS — M5410 Radiculopathy, site unspecified: Secondary | ICD-10-CM

## 2017-01-01 DIAGNOSIS — Z800 Family history of malignant neoplasm of digestive organs: Secondary | ICD-10-CM | POA: Diagnosis not present

## 2017-01-01 DIAGNOSIS — M5412 Radiculopathy, cervical region: Secondary | ICD-10-CM | POA: Diagnosis not present

## 2017-01-01 DIAGNOSIS — Z23 Encounter for immunization: Secondary | ICD-10-CM

## 2017-01-01 DIAGNOSIS — K580 Irritable bowel syndrome with diarrhea: Secondary | ICD-10-CM | POA: Diagnosis not present

## 2017-01-01 DIAGNOSIS — M19041 Primary osteoarthritis, right hand: Secondary | ICD-10-CM | POA: Insufficient documentation

## 2017-01-01 DIAGNOSIS — E669 Obesity, unspecified: Secondary | ICD-10-CM | POA: Insufficient documentation

## 2017-01-01 DIAGNOSIS — Z6839 Body mass index (BMI) 39.0-39.9, adult: Secondary | ICD-10-CM | POA: Diagnosis not present

## 2017-01-01 DIAGNOSIS — G4700 Insomnia, unspecified: Secondary | ICD-10-CM | POA: Diagnosis not present

## 2017-01-01 DIAGNOSIS — Z8249 Family history of ischemic heart disease and other diseases of the circulatory system: Secondary | ICD-10-CM | POA: Insufficient documentation

## 2017-01-01 DIAGNOSIS — Z8673 Personal history of transient ischemic attack (TIA), and cerebral infarction without residual deficits: Secondary | ICD-10-CM | POA: Diagnosis not present

## 2017-01-01 DIAGNOSIS — Z7982 Long term (current) use of aspirin: Secondary | ICD-10-CM | POA: Diagnosis not present

## 2017-01-01 DIAGNOSIS — Z880 Allergy status to penicillin: Secondary | ICD-10-CM | POA: Diagnosis not present

## 2017-01-01 DIAGNOSIS — M19042 Primary osteoarthritis, left hand: Secondary | ICD-10-CM | POA: Insufficient documentation

## 2017-01-01 DIAGNOSIS — I10 Essential (primary) hypertension: Secondary | ICD-10-CM | POA: Diagnosis not present

## 2017-01-01 DIAGNOSIS — F419 Anxiety disorder, unspecified: Secondary | ICD-10-CM | POA: Insufficient documentation

## 2017-01-01 DIAGNOSIS — Z9889 Other specified postprocedural states: Secondary | ICD-10-CM | POA: Diagnosis not present

## 2017-01-01 DIAGNOSIS — I7300 Raynaud's syndrome without gangrene: Secondary | ICD-10-CM | POA: Insufficient documentation

## 2017-01-01 DIAGNOSIS — F329 Major depressive disorder, single episode, unspecified: Secondary | ICD-10-CM | POA: Insufficient documentation

## 2017-01-01 DIAGNOSIS — J439 Emphysema, unspecified: Secondary | ICD-10-CM | POA: Diagnosis not present

## 2017-01-01 DIAGNOSIS — Z87891 Personal history of nicotine dependence: Secondary | ICD-10-CM | POA: Insufficient documentation

## 2017-01-01 DIAGNOSIS — M5416 Radiculopathy, lumbar region: Secondary | ICD-10-CM | POA: Insufficient documentation

## 2017-01-01 DIAGNOSIS — E559 Vitamin D deficiency, unspecified: Secondary | ICD-10-CM | POA: Diagnosis not present

## 2017-01-01 NOTE — Patient Instructions (Addendum)
Use Advair inhaler twice a day as prescribed.  I have referred you for some physical therapy for your back.  Cut back on Valium to 1/2 of the 5 mg tab as needed. Subsequent refills will reflect this change. This medication can be habit forming and addictive.    Insomnia Insomnia is a sleep disorder that makes it difficult to fall asleep or to stay asleep. Insomnia can cause tiredness (fatigue), low energy, difficulty concentrating, mood swings, and poor performance at work or school. There are three different ways to classify insomnia:  Difficulty falling asleep.  Difficulty staying asleep.  Waking up too early in the morning.  Any type of insomnia can be long-term (chronic) or short-term (acute). Both are common. Short-term insomnia usually lasts for three months or less. Chronic insomnia occurs at least three times a week for longer than three months. What are the causes? Insomnia may be caused by another condition, situation, or substance, such as:  Anxiety.  Certain medicines.  Gastroesophageal reflux disease (GERD) or other gastrointestinal conditions.  Asthma or other breathing conditions.  Restless legs syndrome, sleep apnea, or other sleep disorders.  Chronic pain.  Menopause. This may include hot flashes.  Stroke.  Abuse of alcohol, tobacco, or illegal drugs.  Depression.  Caffeine.  Neurological disorders, such as Alzheimer disease.  An overactive thyroid (hyperthyroidism).  The cause of insomnia may not be known. What increases the risk? Risk factors for insomnia include:  Gender. Women are more commonly affected than men.  Age. Insomnia is more common as you get older.  Stress. This may involve your professional or personal life.  Income. Insomnia is more common in people with lower income.  Lack of exercise.  Irregular work schedule or night shifts.  Traveling between different time zones.  What are the signs or symptoms? If you have  insomnia, trouble falling asleep or trouble staying asleep is the main symptom. This may lead to other symptoms, such as:  Feeling fatigued.  Feeling nervous about going to sleep.  Not feeling rested in the morning.  Having trouble concentrating.  Feeling irritable, anxious, or depressed.  How is this treated? Treatment for insomnia depends on the cause. If your insomnia is caused by an underlying condition, treatment will focus on addressing the condition. Treatment may also include:  Medicines to help you sleep.  Counseling or therapy.  Lifestyle adjustments.  Follow these instructions at home:  Take medicines only as directed by your health care provider.  Keep regular sleeping and waking hours. Avoid naps.  Keep a sleep diary to help you and your health care provider figure out what could be causing your insomnia. Include: ? When you sleep. ? When you wake up during the night. ? How well you sleep. ? How rested you feel the next day. ? Any side effects of medicines you are taking. ? What you eat and drink.  Make your bedroom a comfortable place where it is easy to fall asleep: ? Put up shades or special blackout curtains to block light from outside. ? Use a white noise machine to block noise. ? Keep the temperature cool.  Exercise regularly as directed by your health care provider. Avoid exercising right before bedtime.  Use relaxation techniques to manage stress. Ask your health care provider to suggest some techniques that may work well for you. These may include: ? Breathing exercises. ? Routines to release muscle tension. ? Visualizing peaceful scenes.  Cut back on alcohol, caffeinated beverages, and cigarettes, especially  close to bedtime. These can disrupt your sleep.  Do not overeat or eat spicy foods right before bedtime. This can lead to digestive discomfort that can make it hard for you to sleep.  Limit screen use before bedtime. This  includes: ? Watching TV. ? Using your smartphone, tablet, and computer.  Stick to a routine. This can help you fall asleep faster. Try to do a quiet activity, brush your teeth, and go to bed at the same time each night.  Get out of bed if you are still awake after 15 minutes of trying to sleep. Keep the lights down, but try reading or doing a quiet activity. When you feel sleepy, go back to bed.  Make sure that you drive carefully. Avoid driving if you feel very sleepy.  Keep all follow-up appointments as directed by your health care provider. This is important. Contact a health care provider if:  You are tired throughout the day or have trouble in your daily routine due to sleepiness.  You continue to have sleep problems or your sleep problems get worse. Get help right away if:  You have serious thoughts about hurting yourself or someone else. This information is not intended to replace advice given to you by your health care provider. Make sure you discuss any questions you have with your health care provider. Document Released: 04/07/2000 Document Revised: 09/10/2015 Document Reviewed: 01/09/2014 Elsevier Interactive Patient Education  2018 [employer]   Influenza Virus Vaccine injection (Fluarix) What is this medicine? INFLUENZA VIRUS VACCINE (in floo EN zuh VAHY ruhs vak SEEN) helps to reduce the risk of getting influenza also known as the flu. This medicine may be used for other purposes; ask your health care provider or pharmacist if you have questions. COMMON BRAND NAME(S): Fluarix, Fluzone What should I tell my health care provider before I take this medicine? They need to know if you have any of these conditions: -bleeding disorder like hemophilia -fever or infection -Guillain-Barre syndrome or other neurological problems -immune system problems -infection with the human immunodeficiency virus (HIV) or AIDS -low blood platelet counts -multiple sclerosis -an unusual  or allergic reaction to influenza virus vaccine, eggs, chicken proteins, latex, gentamicin, other medicines, foods, dyes or preservatives -pregnant or trying to get pregnant -breast-feeding How should I use this medicine? This vaccine is for injection into a muscle. It is given by a health care professional. A copy of Vaccine Information Statements will be given before each vaccination. Read this sheet carefully each time. The sheet may change frequently. Talk to your pediatrician regarding the use of this medicine in children. Special care may be needed. Overdosage: If you think you have taken too much of this medicine contact a poison control center or emergency room at once. NOTE: This medicine is only for you. Do not share this medicine with others. What if I miss a dose? This does not apply. What may interact with this medicine? -chemotherapy or radiation therapy -medicines that lower your immune system like etanercept, anakinra, infliximab, and adalimumab -medicines that treat or prevent blood clots like warfarin -phenytoin -steroid medicines like prednisone or cortisone -theophylline -vaccines This list may not describe all possible interactions. Give your health care provider a list of all the medicines, herbs, non-prescription drugs, or dietary supplements you use. Also tell them if you smoke, drink alcohol, or use illegal drugs. Some items may interact with your medicine. What should I watch for while using this medicine? Report any side effects that do not  go away within 3 days to your doctor or health care professional. Call your health care provider if any unusual symptoms occur within 6 weeks of receiving this vaccine. You may still catch the flu, but the illness is not usually as bad. You cannot get the flu from the vaccine. The vaccine will not protect against colds or other illnesses that may cause fever. The vaccine is needed every year. What side effects may I notice from  receiving this medicine? Side effects that you should report to your doctor or health care professional as soon as possible: -allergic reactions like skin rash, itching or hives, swelling of the face, lips, or tongue Side effects that usually do not require medical attention (report to your doctor or health care professional if they continue or are bothersome): -fever -headache -muscle aches and pains -pain, tenderness, redness, or swelling at site where injected -weak or tired This list may not describe all possible side effects. Call your doctor for medical advice about side effects. You may report side effects to FDA at 1-800-FDA-1088. Where should I keep my medicine? This vaccine is only given in a clinic, pharmacy, doctor's office, or other health care setting and will not be stored at home. NOTE: This sheet is a summary. It may not cover all possible information. If you have questions about this medicine, talk to your doctor, pharmacist, or health care provider.  2018 Elsevier/Gold Standard (2007-11-06 09:30:40)

## 2017-01-01 NOTE — Progress Notes (Signed)
Patient ID: Michelle Stanley, female    DOB: 1960/06/21  MRN: [id number]  CC: RE-ESTABLISH; Back Pain; Neck Pain; and COPD   Subjective: Michelle Stanley is a 56 y.o. female who presents for chronic ds management Her concerns today include:  [age] year old female with history of chronic lumbar and neck pain (on Tramadol, Mobic, Cymbalta, Gabapentin), HTN, COPD, HL, depression/anxiety, osteoarthritis of the hands, and obesity  HTN: checks BP QO wk.  Runs 140-150/90 -tolerates Lisinopril -using salt substitute -no CP/SOB/LE edema  2. COPD: -does albuterol neb 1-2 x a day -"I really like the Spiriva." Breathing better since being on neb and Spiriva -not using the Advair consistently  3. Neck/LBP -stable on combination of Tramadol, Mobic, Gabapentin Pain with radiation to RT leg when she does too much walking.  When standing, better to lean forward Ambulates with cane Pain 8/10 most of the day -worse first thing in morning Can walk about 200 feet before she has to sit down.  Would like to be able to do more. No recent falls Lives in small house so independent in ADLS at home because does not have to walk to far to get to kitchen or bathroom  4. Valium - states she is taking this mainly for sleep.  Before being placed on it,  "I only sleep about 1- 1.5 hrs." Initially said she was taking every night but when challenged about that based on RF hx, she admits that she does not take every night. . Patient Active Problem List   Diagnosis Date Noted  . Breast pain, left 08/31/2016  . Vitamin D insufficiency 10/14/2015  . History of stroke 06/11/2015  . Cervical radicular pain 02/02/2015  . Raynaud's syndrome 02/02/2015  . Anxiety and depression 10/22/2014  . COPD (chronic obstructive pulmonary disease) (HCC) 09/10/2014  . Osteoarthritis of both hands 08/18/2014  . Irritable bowel syndrome with diarrhea 08/18/2014  . Hypertension 08/04/2014  . Obesity 12/10/2012  . Radicular low back  pain 10/31/2011     Current Outpatient Prescriptions on File Prior to Visit  Medication Sig Dispense Refill  . albuterol (PROVENTIL) (2.5 MG/3ML) 0.083% nebulizer solution Take 6 mLs (5 mg total) by nebulization every 6 (six) hours as needed for wheezing or shortness of breath. 150 mL 1  . aspirin 325 MG tablet Take 325 mg by mouth daily.    Marland Kitchen. atorvastatin (LIPITOR) 40 MG tablet TAKE 1 TABLET BY MOUTH DAILY 90 tablet 3  . Cholecalciferol (VITAMIN D3) 2000 units TABS Take 2,000 Units by mouth daily. 30 tablet 11  . diazepam (VALIUM) 5 MG tablet Take 1 tablet (5 mg total) by mouth at bedtime as needed for anxiety. 30 tablet 3  . DULoxetine (CYMBALTA) 30 MG capsule Take 2 capsules (60 mg total) by mouth daily. 60 capsule 5  . Fluticasone-Salmeterol (ADVAIR DISKUS) 500-50 MCG/DOSE AEPB Inhale 1 puff into the lungs 2 (two) times daily. 60 each 5  . gabapentin (NEURONTIN) 300 MG capsule TAKE 2 CAPSULES BY MOUTH 4 TIMES DAILY. 240 capsule 3  . levalbuterol (XOPENEX HFA) 45 MCG/ACT inhaler Inhale 2 puffs into the lungs every 6 (six) hours as needed for wheezing. 1 Inhaler 12  . lisinopril (PRINIVIL,ZESTRIL) 40 MG tablet Take 1 tablet (40 mg total) by mouth daily. 30 tablet 5  . meloxicam (MOBIC) 7.5 MG tablet TAKE 1 TABLET BY MOUTH DAILY AS NEEDED FOR PAIN. 30 tablet 5  . predniSONE (DELTASONE) 10 MG tablet Take by mouth 4 tablets for 2 days, 3  tablets for 2 days, 2 tablets for 2 days, 1 tablet for 3 days then STOP (Patient not taking: Reported on 01/01/2017) 21 tablet 0  . Tiotropium Bromide Monohydrate (SPIRIVA RESPIMAT) 2.5 MCG/ACT AERS Inhale 2 puffs into the lungs daily. Rinse and gargle after each use. 4 g 5  . traMADol (ULTRAM) 50 MG tablet Take 1 tablet (50 mg total) by mouth every 8 (eight) hours as needed. 90 tablet 2   No current facility-administered medications on file prior to visit.     Allergies  Allergen Reactions  . Penicillins Other (See Comments)    welps    Social History    Social History  . Marital status: Single    Spouse name: N/A  . Number of children: N/A  . Years of education: N/A   Occupational History  . Not on file.   Social History Main Topics  . Smoking status: Former Smoker    Packs/day: 1.00    Years: 30.00    Types: Cigarettes    Quit date: 07/17/2014  . Smokeless tobacco: Never Used  . Alcohol use No  . Drug use: No  . Sexual activity: Not on file   Other Topics Concern  . Not on file   Social History Narrative  . No narrative on file    Family History  Problem Relation Age of Onset  . Cancer Mother        deceased of throat & stomach cancer  . Hypertension Mother   . Heart disease Father   . Hypertension Father     Past Surgical History:  Procedure Laterality Date  . Right knee surgery      ROS: Review of Systems Neg except as stated above  PHYSICAL EXAM: BP (!) 145/81   Pulse 80   Temp 98.1 F (36.7 C) (Oral)   Resp 16   Wt 231 lb 6.4 oz (105 kg)   SpO2 95%   BMI 39.72 kg/m   132/78 Physical Exam  General appearance - alert, well appearing,obese middle age caucasian female and in no distress Mental status - alert, oriented to person, place, and time, normal mood, behavior, speech, dress, motor activity, and thought processes Mouth - mucous membranes moist, pharynx normal without lesions Neck - supple, no significant adenopathy Chest - dec but clear, no wheezes Heart - RRR, no murmurs Musculoskeletal - ambulates with a cane. Gait slow but steady Extremities - no LE edema   Lab Results  Component Value Date   HGBA1C 5.9 10/31/2016   MRI lumbar spine 06/2016 IMPRESSION: 1. Facet arthropathy and disc degeneration without progression compared to July 2017. 2. L5-S1 left paracentral disc extrusion with S1 impingement. Mild to moderate left foraminal narrowing. 3. L4-5 right foraminal impingement due to disc bulging and advanced facet arthropathy. Bilateral subarticular recess stenosis with  L5 impingement greater on the right. 4. L3-4 right foraminal and far-lateral disc protrusion with L3 contact.  ASSESSMENT AND PLAN: 1. Essential hypertension -at goal. Continue Lisinopril and DASH  2. Pulmonary emphysema, unspecified emphysema type (HCC) -Advise using Advair BID as prescribed. Went over difference b/w Advair and Spiriva.   3. Radicular low back pain -discussed and encouraged multimodality method in managing chronic pain with goal of improving function. Pt agreeable to P.T referral mainly to try water aerobics -She does not need RF on Tramadol at this time. Will up date control substance prescribing agreement today [facility] reviewed and is appropriate - Ambulatory referral to Physical Therapy  4. Insomnia, unspecified type -  discuss good sleep hygiene including getting in bed around same time each night, avoid drinking caffeinated and ETOH beverages after 5 p.m, turning off all lights/sounds -I am not in favor of using benzos as rxn sleep aides due to potential of dependence, addiction, abuse. Also as pt ages, can increase risk for falls.  Discussed these concerns with pt. She states she does not take every night and RF hx suggest same. Last rxn written for Valium 5 mg 08/31/2016 for 30 tabs with 3 RFs; so far she has filled 2 x. I suggest not taking every night and when she does, take only 1/2 a tab instead of 5 mg.  Any future RF from me will reflect that.  5. Need for influenza vaccination - Flu Vaccine QUAD 6+ mos PF IM (Fluarix Quad PF)   Patient was given the opportunity to ask questions.  Patient verbalized understanding of the plan and was able to repeat key elements of the plan.   Orders Placed This Encounter  Procedures  . Flu Vaccine QUAD 6+ mos PF IM (Fluarix Quad PF)  . Ambulatory referral to Physical Therapy     Requested Prescriptions    No prescriptions requested or ordered in this encounter    No Follow-up on file.  Jonah Blue, MD, FACP

## 2017-01-12 DIAGNOSIS — I10 Essential (primary) hypertension: Secondary | ICD-10-CM | POA: Insufficient documentation

## 2017-01-12 DIAGNOSIS — M5136 Other intervertebral disc degeneration, lumbar region: Secondary | ICD-10-CM | POA: Insufficient documentation

## 2017-01-12 DIAGNOSIS — E782 Mixed hyperlipidemia: Secondary | ICD-10-CM | POA: Insufficient documentation

## 2017-01-17 MED FILL — MELOXICAM 7.5 MG TABLET: 7.5 | 30 days supply | Qty: 30 | Fill #3

## 2017-01-17 MED FILL — ?ATORVASTATIN 40MG TABLET: 40 | 30 days supply | Qty: 30 | Fill #4

## 2017-01-17 MED FILL — GABAPENTIN 300 MG CAPSULE: 300 | 30 days supply | Qty: 240 | Fill #3

## 2017-04-12 IMAGING — MR MR LUMBAR SPINE W/O CM
4 of 5 series · 18 of 48 positions shown · non-contrast
Comparison: 10/23/2015

CLINICAL DATA: Bowel and bladder incontinence.

EXAM:
MRI LUMBAR SPINE WITHOUT CONTRAST
TECHNIQUE: Multiplanar, multisequence MR imaging of the lumbar spine was
performed. No intravenous contrast was administered.

[Series 4: T1 · sagittal · 4.0mm · 0.51mm/px · 3 of 12 slices shown (1 of 2)]
[im 3/12]
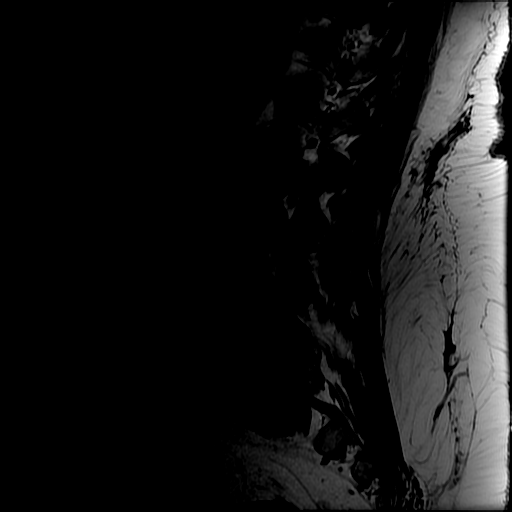
[im 7/12]
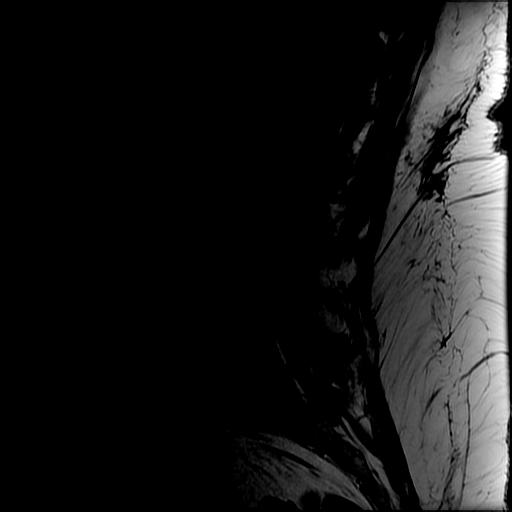
[im 12/12]
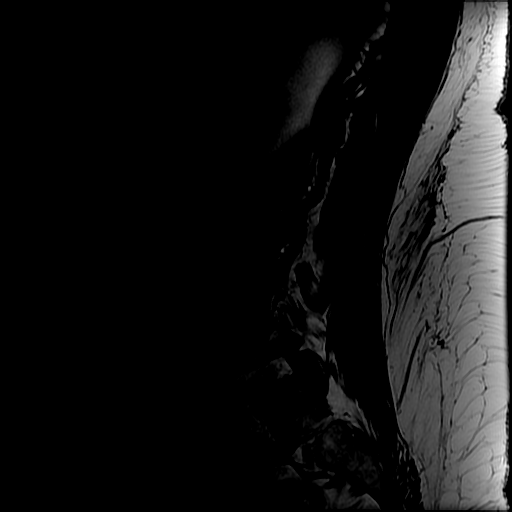

[Series 5: T2 · sagittal · 4.0mm · 0.51mm/px · 6 of 12 slices shown (1 of 2)]
[im 1/12]
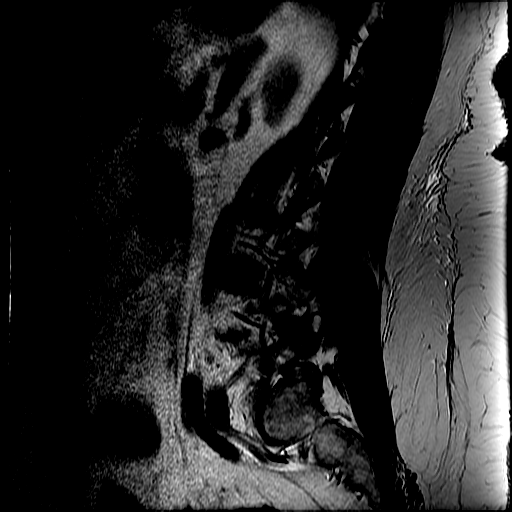
[im 3/12]
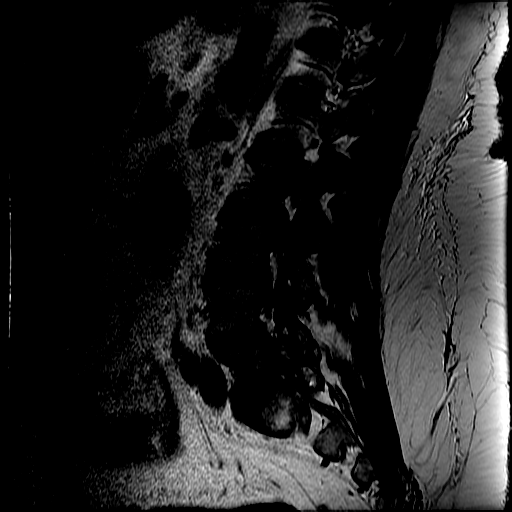
[im 5/12]
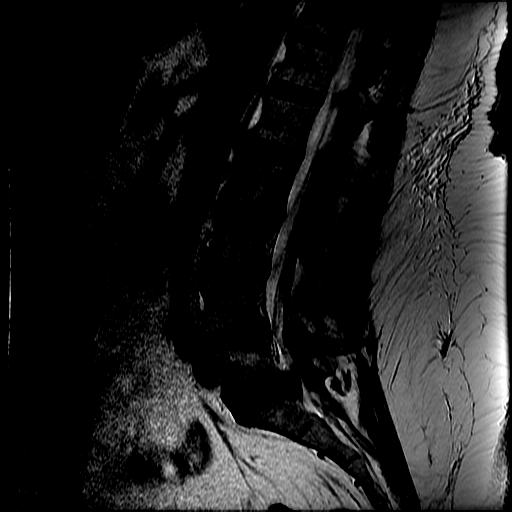
[im 7/12]
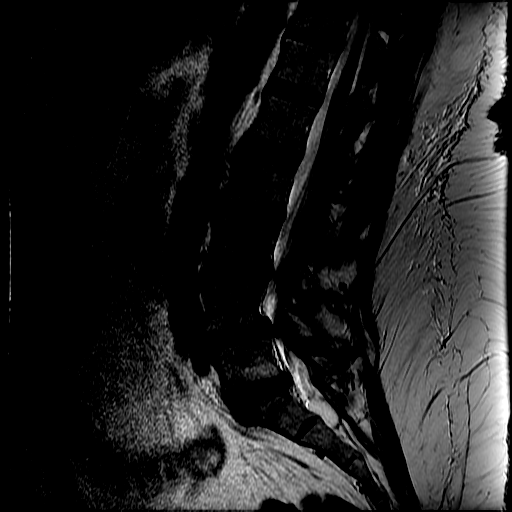
[im 9/12]
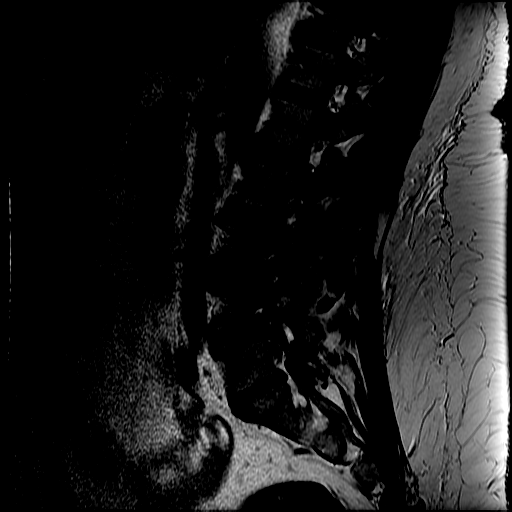
[im 12/12]
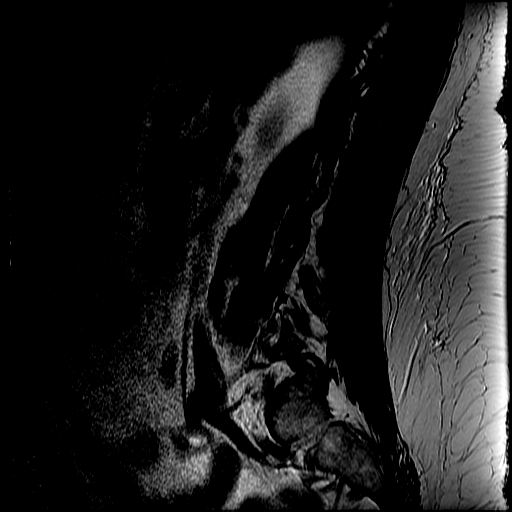

[Series 7: T2 · axial · 4.0mm · 0.39mm/px · z∈[+6,+154]mm · 6 of 32 slices shown (2 of 2)]
[im 1/32]
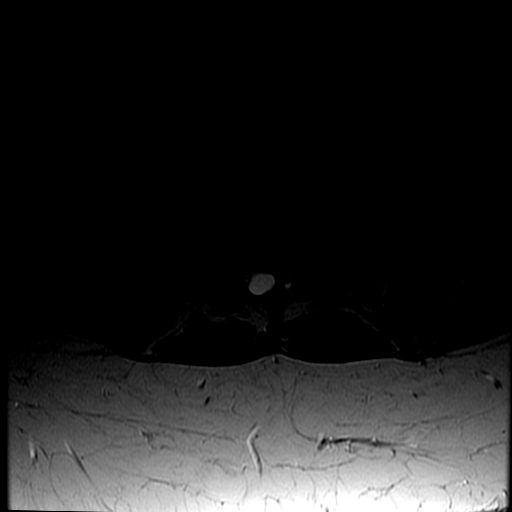
[im 5/32]
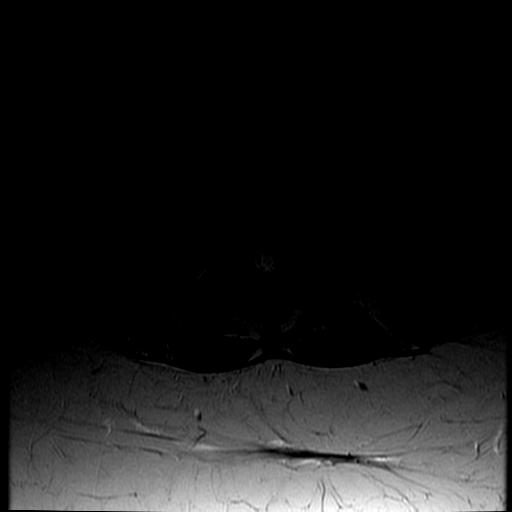
[im 9/32]
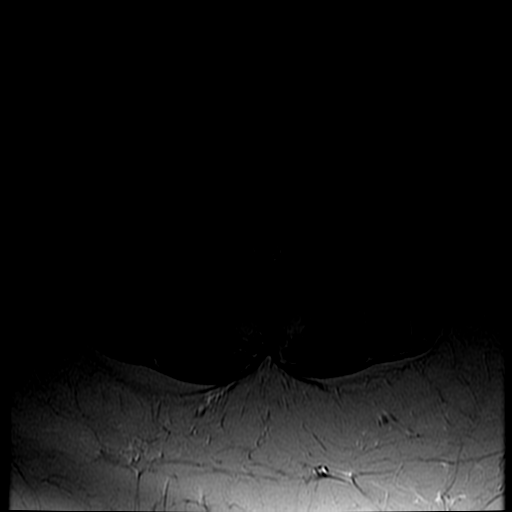
[im 14/32]
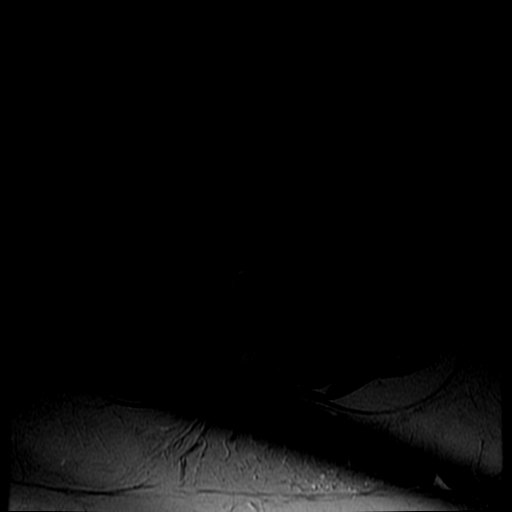
[im 16/32]
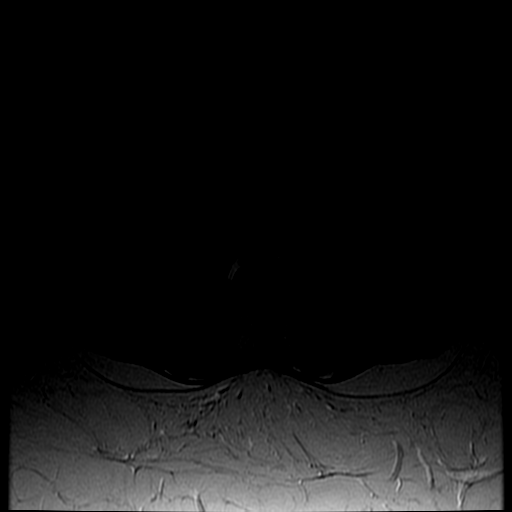
[im 27/32]
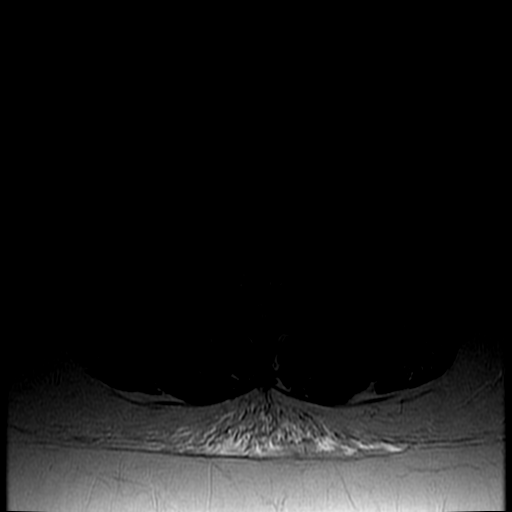

[Series 8: T1 · axial · 4.0mm · 0.39mm/px · z∈[+25,+154]mm · 3 of 32 slices shown (2 of 2)]
[im 5/32]
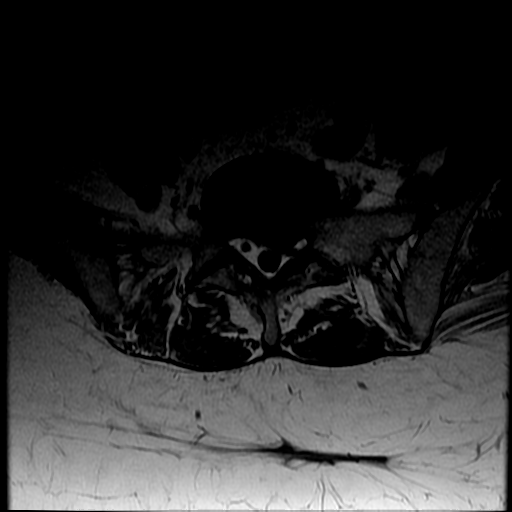
[im 16/32]
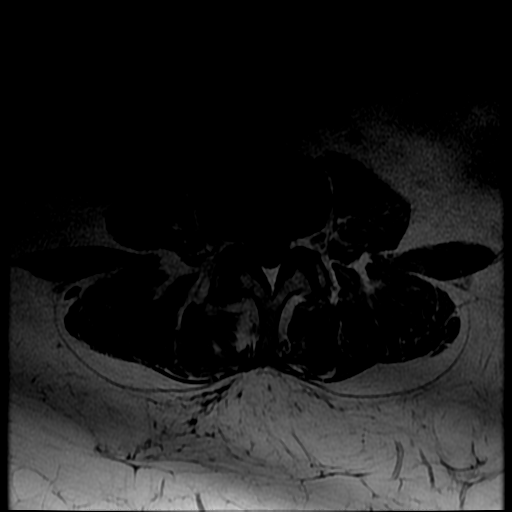
[im 27/32]
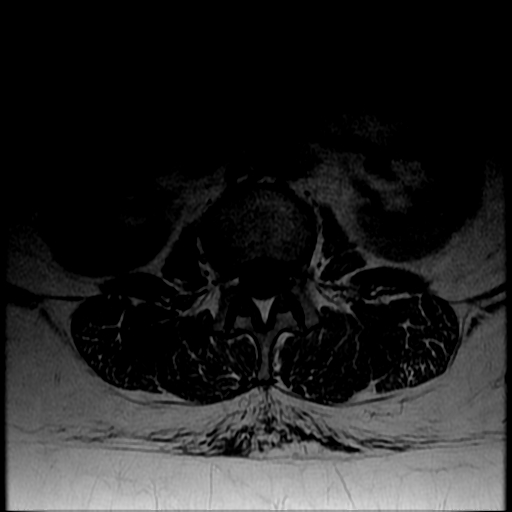

[18 of 48 positions shown; findings below may reference images not displayed]

FINDINGS: Segmentation:  Standard.

Alignment: Slight anterolisthesis at L4-5, facet mediated and
chronic.

Vertebrae: Degenerative alteration of marrow signal about the L5-S1
disc.

Conus medullaris: Extends to the L1 level and appears normal.

Paraspinal and other soft tissues: Negative

Disc levels:

T12- L1: Unremarkable.

L1-L2: Unremarkable.

L2-L3: Mild disc narrowing and desiccation. Small left inferior
foraminal protrusion, noncompressive.

L3-L4: Facet arthropathy with asymmetric right joint effusion and
spurring. Ligamentum flavum thickening. Right foraminal and
far-lateral protrusion, chronic, with L3 contact. Patent spinal
canal

L4-L5: Advanced facet arthropathy with joint distortion, effusions,
and ligamentum flavum thickening. Right eccentric disc bulging or
broad protrusion with L4 impingement in the foramen and far lateral
region. Bilateral subarticular recess narrowing, greater on the
right.

L5-S1:Left paracentral disc extrusion that is downward migrating.
Discal cyst has diminished but there is still S1 impingement in the
subarticular recess. Facet arthropathy with spurring and ligamentum
flavum thickening.Left foraminal narrowing is mild to moderate.
IMPRESSION: 1. Facet arthropathy and disc degeneration without progression
compared to October 2015.
2. L5-S1 left paracentral disc extrusion with S1 impingement. Mild
to moderate left foraminal narrowing.
3. L4-5 right foraminal impingement due to disc bulging and advanced
facet arthropathy. Bilateral subarticular recess stenosis with L5
impingement greater on the right.
4. L3-4 right foraminal and far-lateral disc protrusion with L3
contact.

## 2017-09-24 DIAGNOSIS — R7303 Prediabetes: Secondary | ICD-10-CM | POA: Insufficient documentation

## 2017-11-02 DIAGNOSIS — G8929 Other chronic pain: Secondary | ICD-10-CM | POA: Insufficient documentation

## 2020-02-26 DIAGNOSIS — M5090 Cervical disc disorder, unspecified, unspecified cervical region: Secondary | ICD-10-CM | POA: Insufficient documentation

## 2020-04-27 DIAGNOSIS — J410 Simple chronic bronchitis: Secondary | ICD-10-CM | POA: Insufficient documentation

## 2021-06-13 DIAGNOSIS — R200 Anesthesia of skin: Secondary | ICD-10-CM | POA: Insufficient documentation

## 2021-08-22 DIAGNOSIS — M47816 Spondylosis without myelopathy or radiculopathy, lumbar region: Secondary | ICD-10-CM | POA: Insufficient documentation

## 2022-08-02 ENCOUNTER — Ambulatory Visit: Payer: Medicare Other | Admitting: Podiatry

## 2022-08-02 ENCOUNTER — Encounter: Payer: Self-pay | Admitting: Podiatry

## 2022-08-02 DIAGNOSIS — D2372 Other benign neoplasm of skin of left lower limb, including hip: Secondary | ICD-10-CM | POA: Diagnosis not present

## 2022-08-02 DIAGNOSIS — D2371 Other benign neoplasm of skin of right lower limb, including hip: Secondary | ICD-10-CM | POA: Diagnosis not present

## 2022-08-02 DIAGNOSIS — M7751 Other enthesopathy of right foot: Secondary | ICD-10-CM | POA: Diagnosis not present

## 2022-08-02 DIAGNOSIS — M79676 Pain in unspecified toe(s): Secondary | ICD-10-CM | POA: Diagnosis not present

## 2022-08-02 DIAGNOSIS — B351 Tinea unguium: Secondary | ICD-10-CM

## 2022-08-02 DIAGNOSIS — M2040 Other hammer toe(s) (acquired), unspecified foot: Secondary | ICD-10-CM

## 2022-08-02 MED ORDER — TRIAMCINOLONE ACETONIDE 40 MG/ML IJ SUSP
20.0000 mg | Freq: Once | INTRAMUSCULAR | Status: AC
  Administered 2022-08-02: 20 mg

## 2022-08-02 NOTE — Progress Notes (Signed)
Subjective:  Patient ID: Michelle Stanley, female    DOB: [birth year],  MRN: [id number] HPI Chief Complaint  Patient presents with   Debridement    Requesting nail trim - general foot exam    New Patient (Initial Visit)    62 y.o. female presents with the above complaint.   ROS: Denies fever chills nausea vomit muscle aches pains calf pain back pain chest pain shortness of breath.  Past Medical History:  Diagnosis Date   Hypertension Dx 2009   Irritable bowel syndrome Aug 2009   Sciatic nerve pain    Sciatic pain    Stroke Aug 2009   Past Surgical History:  Procedure Laterality Date   Right knee surgery      Current Outpatient Medications:    oxyCODONE-acetaminophen (PERCOCET) 10-325 MG tablet, Take 1 tablet by mouth every 6 (six) hours as needed., Disp: , Rfl:    albuterol (PROVENTIL) (2.5 MG/3ML) 0.083% nebulizer solution, Take 6 mLs (5 mg total) by nebulization every 6 (six) hours as needed for wheezing or shortness of breath., Disp: 150 mL, Rfl: 1   aspirin 325 MG tablet, Take 325 mg by mouth daily., Disp: , Rfl:    atorvastatin (LIPITOR) 40 MG tablet, TAKE 1 TABLET BY MOUTH DAILY, Disp: 90 tablet, Rfl: 3   Cholecalciferol (VITAMIN D3) 2000 units TABS, Take 2,000 Units by mouth daily., Disp: 30 tablet, Rfl: 11   diazepam (VALIUM) 5 MG tablet, Take 1 tablet (5 mg total) by mouth at bedtime as needed for anxiety., Disp: 30 tablet, Rfl: 3   DULoxetine (CYMBALTA) 30 MG capsule, Take 2 capsules (60 mg total) by mouth daily., Disp: 60 capsule, Rfl: 5   Fluticasone-Salmeterol (ADVAIR DISKUS) 500-50 MCG/DOSE AEPB, Inhale 1 puff into the lungs 2 (two) times daily., Disp: 60 each, Rfl: 5   gabapentin (NEURONTIN) 300 MG capsule, TAKE 2 CAPSULES BY MOUTH 4 TIMES DAILY., Disp: 240 capsule, Rfl: 3   levalbuterol (XOPENEX HFA) 45 MCG/ACT inhaler, Inhale 2 puffs into the lungs every 6 (six) hours as needed for wheezing., Disp: 1 Inhaler, Rfl: 12   lisinopril (PRINIVIL,ZESTRIL) 40 MG tablet,  Take 1 tablet (40 mg total) by mouth daily., Disp: 30 tablet, Rfl: 5   meloxicam (MOBIC) 7.5 MG tablet, TAKE 1 TABLET BY MOUTH DAILY AS NEEDED FOR PAIN., Disp: 30 tablet, Rfl: 5   predniSONE (DELTASONE) 10 MG tablet, Take by mouth 4 tablets for 2 days, 3 tablets for 2 days, 2 tablets for 2 days, 1 tablet for 3 days then STOP (Patient not taking: Reported on 01/01/2017), Disp: 21 tablet, Rfl: 0   Tiotropium Bromide Monohydrate (SPIRIVA RESPIMAT) 2.5 MCG/ACT AERS, Inhale 2 puffs into the lungs daily. Rinse and gargle after each use., Disp: 4 g, Rfl: 5   traMADol (ULTRAM) 50 MG tablet, Take 1 tablet (50 mg total) by mouth every 8 (eight) hours as needed., Disp: 90 tablet, Rfl: 2  Allergies  Allergen Reactions   Penicillins Other (See Comments)    welps   Review of Systems Objective:  There were no vitals filed for this visit.  General: Well developed, nourished, in no acute distress, alert and oriented x3   Dermatological: Skin is warm, dry and supple bilateral. Nails x 10 are well maintained but they are long thick yellow dystrophic onychomycotic.; remaining integument appears unremarkable at this time. There are no open sores, no preulcerative lesions, no rash or signs of infection present.  Painful benign skin lesion subfifth metatarsal bilateral.  Vascular: Dorsalis Pedis  artery and Posterior Tibial artery pedal pulses are 2/4 bilateral with immedate capillary fill time. Pedal hair growth present. No varicosities and no lower extremity edema present bilateral.   Neruologic: Grossly intact via light touch bilateral. Vibratory intact via tuning fork bilateral. Protective threshold with Semmes Wienstein monofilament intact to all pedal sites bilateral. Patellar and Achilles deep tendon reflexes 2+ bilateral. No Babinski or clonus noted bilateral.   Musculoskeletal: No gross boney pedal deformities bilateral. No pain, crepitus, or limitation noted with foot and ankle range of motion bilateral.  Muscular strength 5/5 in all groups tested bilateral.  Significant overlapping hallux abductovalgus deformities bilateral.  Pain on end range of motion and palpation of the sinus tarsi and subtalar joint.  Gait: Unassisted, Nonantalgic.    Radiographs:  None taken  Assessment & Plan:   Assessment: Hallux abductovalgus deformity and hammertoe deformities bilateral pain in limb secondary to onychomycosis and benign skin lesions.  Subtalar joint capsulitis right  Plan: Debridement of nails and all reactive hyperkeratotic tissue.  I injected the subtalar joint.  10 mg Kenalog 5 mg Marcaine.     Michelle Stanley, [place]

## 2022-08-30 DIAGNOSIS — M21619 Bunion of unspecified foot: Secondary | ICD-10-CM | POA: Insufficient documentation

## 2022-09-11 ENCOUNTER — Ambulatory Visit: Payer: Medicare Other | Admitting: Podiatry

## 2022-09-11 ENCOUNTER — Encounter: Payer: Self-pay | Admitting: Podiatry

## 2022-09-11 ENCOUNTER — Ambulatory Visit (INDEPENDENT_AMBULATORY_CARE_PROVIDER_SITE_OTHER): Payer: Medicare Other

## 2022-09-11 DIAGNOSIS — M2012 Hallux valgus (acquired), left foot: Secondary | ICD-10-CM

## 2022-09-11 DIAGNOSIS — M2040 Other hammer toe(s) (acquired), unspecified foot: Secondary | ICD-10-CM

## 2022-09-11 DIAGNOSIS — M2042 Other hammer toe(s) (acquired), left foot: Secondary | ICD-10-CM

## 2022-09-12 NOTE — Progress Notes (Signed)
She presents today to discuss surgical intervention of her bunion deformity on her left foot.  She states that is becoming more more painful over time she is tried different shoes anti-inflammatories all to no avail.  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  Currently she has time to get this done and at this point.  Objective: Vital signs are stable she alert oriented x 3.  Pulses are palpable.  She has severe hallux valgus deformity of the left foot.  Is hypermobile at the medial cuneiform joint.  Radiographs demonstrate increase in the first and metatarsal angle greater than 15 degrees hallux abductus angle greater than 25 degrees with dislocation.  Hammertoe deformity second left contracture at the metatarsal phalangeal joint.  Assessment: Hallux abductovalgus deformity.  Plan: Discussed etiology pathology and surgical therapies I explained to her that at this point I think the best for her would be Lapa plasty.  She will consult with Dr. Lilian Kapur regarding this I did explain to her that he may decide to do something different she understands this and is amenable to it.

## 2022-09-20 ENCOUNTER — Ambulatory Visit: Payer: Medicare Other | Admitting: Podiatry

## 2022-09-20 DIAGNOSIS — M2012 Hallux valgus (acquired), left foot: Secondary | ICD-10-CM | POA: Diagnosis not present

## 2022-09-20 DIAGNOSIS — M2042 Other hammer toe(s) (acquired), left foot: Secondary | ICD-10-CM | POA: Diagnosis not present

## 2022-09-20 NOTE — Progress Notes (Signed)
  Subjective:  Patient ID: Michelle Stanley, female    DOB: 16-Dec-1960,  MRN: [id number]  Chief Complaint  Patient presents with   Michelle Stanley    Surgery consult Dr Al Corpus pt    62 y.o. female presents with the above complaint. History confirmed with patient.  She says she has had a bunion for many years.  It is not particularly painful.  It does sometimes rub on the second toe but does not really hurt or physically limit her.  Objective:  Physical Exam: warm, good capillary refill, no trophic changes or ulcerative lesions, normal DP and PT pulses, normal sensory exam, and on the left foot she has hallux valgus deformity that is reducible, good range of motion of joint, no pain to palpation.  Radiographs: Multiple views x-ray of the left foot: Previous radiographs reviewed taken on 09/11/2022 show hallux valgus deformity with increase in the intermetatarsal angle and deviation of the hallux, joint space maintained Assessment:   1. Hav (hallux abducto valgus), left   2. Hammer toe of left foot      Plan:  Patient was evaluated and treated and all questions answered.  I reviewed her radiographs and discussed with her my clinical exam findings.  We discussed the etiology and treatment options of bunion deformities.  She says is not particularly painful but occasionally does rub on the second toe.  Says it is not lifestyle limiting.  We discussed nonsurgical treatment options including wider shoes padding and offloading.  I dispensed her silicone pads to offload the second toe that is rubbing against the second toe.  She will let me know if this is not helpful or worsens and is interested in further surgical intervention.  We discussed that she would need a Lapidus bunionectomy and the recovery process briefly.  Return if symptoms worsen or fail to improve.

## 2022-09-20 NOTE — Patient Instructions (Signed)
More silicone pads can be purchased from:  [email or web address]  

## 2022-10-23 DIAGNOSIS — M48061 Spinal stenosis, lumbar region without neurogenic claudication: Secondary | ICD-10-CM | POA: Insufficient documentation

## 2022-10-25 ENCOUNTER — Ambulatory Visit: Payer: Medicare Other | Admitting: Podiatry

## 2022-10-25 ENCOUNTER — Encounter: Payer: Self-pay | Admitting: Podiatry

## 2022-10-25 DIAGNOSIS — M79676 Pain in unspecified toe(s): Secondary | ICD-10-CM | POA: Diagnosis not present

## 2022-10-25 DIAGNOSIS — B351 Tinea unguium: Secondary | ICD-10-CM | POA: Diagnosis not present

## 2022-10-25 NOTE — Progress Notes (Signed)
She presents today chief complaint of painful elongated toenails.  Objective: Vital signs are stable alert and oriented x 3.  Pulses are palpable.  There is no erythema edema cellulitis drainage or odor toenails are long thick yellow dystrophic onychomycotic sharply incurvated.  Assessment: Pain in limb secondary to onychomycosis.  Plan: Debridement of toenails 1 through 5 bilateral iatrogenic lesion noted to the hallux left where I trimmed the corner of the nail out and there was bleeding.  I placed silver nitrate on this as well as Betadine and antibiotic ointment and dressings.  Instructed her to leave this on today she will start taking care of this tomorrow should it be tender or bleeding.  Should this worsen she will notify us immediately.

## 2023-01-24 ENCOUNTER — Ambulatory Visit: Payer: Medicare Other | Admitting: Podiatry

## 2023-01-24 ENCOUNTER — Encounter: Payer: Self-pay | Admitting: Podiatry

## 2023-01-24 DIAGNOSIS — B351 Tinea unguium: Secondary | ICD-10-CM

## 2023-01-24 DIAGNOSIS — M79676 Pain in unspecified toe(s): Secondary | ICD-10-CM

## 2023-01-24 NOTE — Progress Notes (Signed)
She presents today chief complaint of painful elongated toenails.  Objective: Vital signs stable alert oriented x 3 there is no erythema Dem salines drainage odor nails are long thick yellow dystrophic onychomycotic painful palpation.  Assessment: Pain limb secondary to onychomycosis.  Plan: Debridement of toenails 1 through 5 bilateral

## 2023-07-16 ENCOUNTER — Ambulatory Visit: Admitting: Podiatry

## 2023-07-16 ENCOUNTER — Encounter: Payer: Self-pay | Admitting: Podiatry

## 2023-07-16 DIAGNOSIS — M79676 Pain in unspecified toe(s): Secondary | ICD-10-CM | POA: Diagnosis not present

## 2023-07-16 DIAGNOSIS — B351 Tinea unguium: Secondary | ICD-10-CM | POA: Diagnosis not present

## 2023-07-16 NOTE — Progress Notes (Signed)
   Established Patient Office Visit  Subjective   Patient ID: Michelle Stanley, female    DOB: 20-May-1960  Age: 63 y.o. MRN: [id number]  Chief Complaint  Patient presents with   Debridement    Trim toenails/calluses    HPI    ROS    Objective:     There were no vitals taken for this visit.   Physical Exam   No results found for any visits on 07/16/23.    The ASCVD Risk score (Arnett DK, et al., 2019) failed to calculate for the following reasons:   Risk score cannot be calculated because patient has a medical history suggesting prior/existing ASCVD    Assessment & Plan:   Problem List Items Addressed This Visit   None Visit Diagnoses       Pain due to onychomycosis of toenail    -  Primary       Return in about 3 months (around 10/16/2023) for Routine Foot Care.    Rashay Barnette Maud Deed, DPM

## 2023-07-16 NOTE — Progress Notes (Signed)
She presents today chief complaint of painful elongated toenails.  Objective: Vital signs stable alert oriented x 3 there is no erythema Dem salines drainage odor nails are long thick yellow dystrophic onychomycotic painful palpation.  Assessment: Pain limb secondary to onychomycosis.  Plan: Debridement of toenails 1 through 5 bilateral

## 2023-10-17 ENCOUNTER — Ambulatory Visit: Admitting: Podiatry

## 2023-11-14 ENCOUNTER — Encounter: Payer: Self-pay | Admitting: Podiatry

## 2023-11-14 ENCOUNTER — Ambulatory Visit: Admitting: Podiatry

## 2023-11-14 DIAGNOSIS — M79676 Pain in unspecified toe(s): Secondary | ICD-10-CM

## 2023-11-14 DIAGNOSIS — B351 Tinea unguium: Secondary | ICD-10-CM

## 2023-11-14 NOTE — Progress Notes (Signed)
She presents today chief complaint of painful elongated toenails.  Objective: Vital signs stable alert oriented x 3 there is no erythema Dem salines drainage odor nails are long thick yellow dystrophic onychomycotic painful palpation.  Assessment: Pain limb secondary to onychomycosis.  Plan: Debridement of toenails 1 through 5 bilateral

## 2024-02-13 ENCOUNTER — Ambulatory Visit: Admitting: Podiatry

## 2024-02-13 DIAGNOSIS — M79676 Pain in unspecified toe(s): Secondary | ICD-10-CM

## 2024-02-13 DIAGNOSIS — B351 Tinea unguium: Secondary | ICD-10-CM | POA: Diagnosis not present

## 2024-02-13 NOTE — Progress Notes (Signed)
She presents today chief complaint of painful elongated toenails.  Objective: Vital signs stable alert oriented x 3 there is no erythema Dem salines drainage odor nails are long thick yellow dystrophic onychomycotic painful palpation.  Assessment: Pain limb secondary to onychomycosis.  Plan: Debridement of toenails 1 through 5 bilateral

## 2024-05-14 ENCOUNTER — Ambulatory Visit: Admitting: Podiatry

## 2024-05-28 ENCOUNTER — Ambulatory Visit: Admitting: Podiatry

## 2024-06-11 ENCOUNTER — Ambulatory Visit: Admitting: Podiatry
